# Patient Record
Sex: Female | Born: 1975 | Race: Black or African American | Hispanic: No | Marital: Married | Smoking: Never smoker
Health system: Southern US, Community
[De-identification: ages and names within clinical notes are randomized; demographics above are authoritative.]

## PROBLEM LIST (undated history)

## (undated) DIAGNOSIS — E559 Vitamin D deficiency, unspecified: Secondary | ICD-10-CM

## (undated) DIAGNOSIS — R0600 Dyspnea, unspecified: Secondary | ICD-10-CM

## (undated) DIAGNOSIS — G932 Benign intracranial hypertension: Secondary | ICD-10-CM

## (undated) DIAGNOSIS — G4730 Sleep apnea, unspecified: Secondary | ICD-10-CM

## (undated) DIAGNOSIS — Z9109 Other allergy status, other than to drugs and biological substances: Secondary | ICD-10-CM

## (undated) DIAGNOSIS — K5900 Constipation, unspecified: Secondary | ICD-10-CM

## (undated) DIAGNOSIS — T7840XA Allergy, unspecified, initial encounter: Secondary | ICD-10-CM

## (undated) DIAGNOSIS — D649 Anemia, unspecified: Secondary | ICD-10-CM

## (undated) DIAGNOSIS — I10 Essential (primary) hypertension: Secondary | ICD-10-CM

## (undated) HISTORY — DX: Essential (primary) hypertension: I10

## (undated) HISTORY — DX: Constipation, unspecified: K59.00

## (undated) HISTORY — DX: Sleep apnea, unspecified: G47.30

## (undated) HISTORY — DX: Vitamin D deficiency, unspecified: E55.9

## (undated) HISTORY — PX: BREAST SURGERY: SHX581

## (undated) HISTORY — DX: Benign intracranial hypertension: G93.2

## (undated) HISTORY — DX: Other allergy status, other than to drugs and biological substances: Z91.09

## (undated) HISTORY — DX: Allergy, unspecified, initial encounter: T78.40XA

## (undated) HISTORY — DX: Anemia, unspecified: D64.9

## (undated) HISTORY — DX: Dyspnea, unspecified: R06.00

---

## 1999-01-09 ENCOUNTER — Other Ambulatory Visit: Admission: RE | Admit: 1999-01-09 | Discharge: 1999-01-09 | Payer: Self-pay | Admitting: *Deleted

## 1999-04-13 ENCOUNTER — Other Ambulatory Visit: Admission: RE | Admit: 1999-04-13 | Discharge: 1999-04-13 | Payer: Self-pay | Admitting: *Deleted

## 2000-01-24 ENCOUNTER — Other Ambulatory Visit: Admission: RE | Admit: 2000-01-24 | Discharge: 2000-01-24 | Payer: Self-pay | Admitting: *Deleted

## 2000-03-19 HISTORY — PX: REDUCTION MAMMAPLASTY: SUR839

## 2001-03-24 ENCOUNTER — Other Ambulatory Visit: Admission: RE | Admit: 2001-03-24 | Discharge: 2001-03-24 | Payer: Self-pay | Admitting: Obstetrics and Gynecology

## 2002-06-30 ENCOUNTER — Other Ambulatory Visit: Admission: RE | Admit: 2002-06-30 | Discharge: 2002-06-30 | Payer: Self-pay | Admitting: Gynecology

## 2003-08-13 ENCOUNTER — Other Ambulatory Visit: Admission: RE | Admit: 2003-08-13 | Discharge: 2003-08-13 | Payer: Self-pay | Admitting: Gynecology

## 2003-12-07 ENCOUNTER — Other Ambulatory Visit: Admission: RE | Admit: 2003-12-07 | Discharge: 2003-12-07 | Payer: Self-pay | Admitting: Gynecology

## 2004-10-27 ENCOUNTER — Other Ambulatory Visit: Admission: RE | Admit: 2004-10-27 | Discharge: 2004-10-27 | Payer: Self-pay | Admitting: Gynecology

## 2005-11-01 ENCOUNTER — Other Ambulatory Visit: Admission: RE | Admit: 2005-11-01 | Discharge: 2005-11-01 | Payer: Self-pay | Admitting: Gynecology

## 2006-11-13 ENCOUNTER — Inpatient Hospital Stay (HOSPITAL_COMMUNITY): Admission: AD | Admit: 2006-11-13 | Discharge: 2006-11-16 | Payer: Self-pay | Admitting: Obstetrics and Gynecology

## 2006-11-14 ENCOUNTER — Encounter (HOSPITAL_COMMUNITY): Payer: Self-pay | Admitting: Obstetrics and Gynecology

## 2006-11-18 ENCOUNTER — Encounter: Admission: RE | Admit: 2006-11-18 | Discharge: 2006-12-17 | Payer: Self-pay | Admitting: Obstetrics and Gynecology

## 2009-07-04 ENCOUNTER — Encounter: Admission: RE | Admit: 2009-07-04 | Discharge: 2009-07-04 | Payer: Self-pay | Admitting: Internal Medicine

## 2010-12-29 LAB — LACTATE DEHYDROGENASE: LDH: 132

## 2010-12-29 LAB — CBC
HCT: 27.1 — ABNORMAL LOW
HCT: 34.7 — ABNORMAL LOW
Hemoglobin: 11.6 — ABNORMAL LOW
MCHC: 32.6
MCHC: 33
MCV: 75.8 — ABNORMAL LOW
Platelets: 200
RDW: 14.6 — ABNORMAL HIGH
RDW: 14.9 — ABNORMAL HIGH
RDW: 14.9 — ABNORMAL HIGH
WBC: 10.4
WBC: 11 — ABNORMAL HIGH
WBC: 20 — ABNORMAL HIGH

## 2010-12-29 LAB — COMPREHENSIVE METABOLIC PANEL
ALT: 13
ALT: 13
AST: 18
Alkaline Phosphatase: 202 — ABNORMAL HIGH
BUN: 1 — ABNORMAL LOW
Calcium: 8.9
Calcium: 9
Chloride: 108
Chloride: 108
GFR calc Af Amer: >60
GFR calc non Af Amer: >60
Glucose, Bld: 88
Sodium: 135
Sodium: 135
Total Bilirubin: 0.5
Total Bilirubin: 0.6

## 2010-12-29 LAB — RPR: RPR Ser Ql: NONREACTIVE

## 2010-12-29 LAB — RAPID HIV SCREEN (WH-MAU): Rapid HIV Screen: NONREACTIVE

## 2010-12-29 LAB — URIC ACID: Uric Acid, Serum: 5.5

## 2011-02-07 ENCOUNTER — Other Ambulatory Visit: Payer: Self-pay | Admitting: Obstetrics and Gynecology

## 2011-02-07 DIAGNOSIS — N63 Unspecified lump in unspecified breast: Secondary | ICD-10-CM

## 2011-02-21 ENCOUNTER — Other Ambulatory Visit: Payer: Self-pay

## 2011-02-26 ENCOUNTER — Ambulatory Visit
Admission: RE | Admit: 2011-02-26 | Discharge: 2011-02-26 | Disposition: A | Discharge status: Home / Self Care | Payer: BC Managed Care – PPO | Source: Ambulatory Visit | Attending: Obstetrics and Gynecology | Admitting: Obstetrics and Gynecology

## 2011-02-26 DIAGNOSIS — N63 Unspecified lump in unspecified breast: Secondary | ICD-10-CM

## 2011-04-27 ENCOUNTER — Other Ambulatory Visit (HOSPITAL_COMMUNITY): Payer: Self-pay | Admitting: Pediatrics

## 2011-07-18 ENCOUNTER — Other Ambulatory Visit: Payer: Self-pay | Admitting: Physician Assistant

## 2011-07-19 ENCOUNTER — Encounter: Payer: Self-pay | Admitting: Physician Assistant

## 2011-08-16 ENCOUNTER — Ambulatory Visit (INDEPENDENT_AMBULATORY_CARE_PROVIDER_SITE_OTHER): Payer: BC Managed Care – PPO | Admitting: Physician Assistant

## 2011-08-16 ENCOUNTER — Encounter: Payer: Self-pay | Admitting: Physician Assistant

## 2011-08-16 VITALS — BP 122/86 | HR 85 | Temp 98.1°F | Resp 16 | Ht 64.0 in | Wt 245.2 lb

## 2011-08-16 DIAGNOSIS — Z111 Encounter for screening for respiratory tuberculosis: Secondary | ICD-10-CM

## 2011-08-16 DIAGNOSIS — Z0000 Encounter for general adult medical examination without abnormal findings: Secondary | ICD-10-CM

## 2011-08-16 LAB — POCT URINALYSIS DIPSTICK
Bilirubin, UA: NEGATIVE
Glucose, UA: NEGATIVE
Ketones, UA: NEGATIVE
Leukocytes, UA: NEGATIVE
Nitrite, UA: NEGATIVE
Protein, UA: NEGATIVE
Spec Grav, UA: 1.025
Urobilinogen, UA: 0.2
pH, UA: 5.5

## 2011-08-16 LAB — COMPREHENSIVE METABOLIC PANEL
ALT: 9 U/L (ref 0–35)
AST: 11 U/L (ref 0–37)
Albumin: 4.2 g/dL (ref 3.5–5.2)
Alkaline Phosphatase: 118 U/L — ABNORMAL HIGH (ref 39–117)
BUN: 9 mg/dL (ref 6–23)
CO2: 24 mEq/L (ref 19–32)
Calcium: 8.9 mg/dL (ref 8.4–10.5)
Chloride: 103 mEq/L (ref 96–112)
Creat: 0.67 mg/dL (ref 0.50–1.10)
Glucose, Bld: 87 mg/dL (ref 70–99)
Potassium: 3.9 mEq/L (ref 3.5–5.3)
Sodium: 135 mEq/L (ref 135–145)
Total Bilirubin: 0.5 mg/dL (ref 0.3–1.2)
Total Protein: 7.6 g/dL (ref 6.0–8.3)

## 2011-08-16 LAB — LIPID PANEL
Cholesterol: 170 mg/dL (ref 0–200)
HDL: 44 mg/dL (ref >39)
LDL Cholesterol: 107 mg/dL — ABNORMAL HIGH (ref 0–99)
Total CHOL/HDL Ratio: 3.9 Ratio
Triglycerides: 97 mg/dL (ref <150)
VLDL: 19 mg/dL (ref 0–40)

## 2011-08-16 LAB — TSH: TSH: 1.292 u[IU]/mL (ref 0.350–4.500)

## 2011-08-16 MED ORDER — HYDROCHLOROTHIAZIDE 12.5 MG PO CAPS: 12.5000 mg | ORAL_CAPSULE | Freq: Every day | ORAL | Status: DC

## 2011-08-16 MED ORDER — MOMETASONE FUROATE 50 MCG/ACT NA SUSP: 2.0000 | Freq: Every day | NASAL | Status: DC

## 2011-08-16 NOTE — Progress Notes (Signed)
  Subjective:    Patient ID: Mary Mcdaniel, female    DOB: 07-01-1975, 36 y.o.   MRN: [id number]  HPI Patient presents for CPE. States she is doing well and continues to work as a Scientist, clinical (histocompatibility and immunogenetics). She is still taking HCTZ 12.5 mg daily for her blood pressure but states she has not been taking it this month, although BP still ok. She sees a Gyn for her annual pap and breast exam. Had a mammogram in Dec. 2012 due to finding a lump but everything was normal. She uses Nasonex and Claritin as needed for seasonal allergies.   In addition she has complaints of mild hearing loss and snoring. Believes snoring could be due to continued weight gain.  She is working on exercise and healthy eating.    Review of Systems  All other systems reviewed and are negative.       Objective:   Physical Exam  Vitals reviewed. Constitutional: She is oriented to person, place, and time. She appears well-developed and well-nourished. No distress.  HENT:  Head: Normocephalic and atraumatic.  Right Ear: Hearing, tympanic membrane, external ear and ear canal normal.  Left Ear: Hearing, tympanic membrane, external ear and ear canal normal.  Nose: Nose normal.  Mouth/Throat: Uvula is midline, oropharynx is clear and moist and mucous membranes are normal. No oropharyngeal exudate.  Eyes: Conjunctivae, EOM and lids are normal. Pupils are equal, round, and reactive to light.  Neck: Neck supple. No thyromegaly present.  Cardiovascular: Normal rate, regular rhythm and normal heart sounds.   Pulmonary/Chest: Effort normal and breath sounds normal.  Abdominal: Soft. Bowel sounds are normal. There is no hepatosplenomegaly. There is no tenderness.  Genitourinary: Vagina normal.  Musculoskeletal: Normal range of motion.       Right shoulder: Normal.       Left shoulder: Normal.       Right knee: Normal.       Left knee: Normal.  Lymphadenopathy:    She has no cervical adenopathy.  Neurological: She is alert and  oriented to person, place, and time. No cranial nerve deficit.  Skin: Skin is warm and dry. She is not diaphoretic.  Psychiatric: She has a normal mood and affect. Her behavior is normal. Judgment and thought content normal.          Assessment & Plan:   1. Routine general medical examination at a health care facility  Continue to work on healthy diet and exercise. This will help with blood pressure as well as snoring.  Refill HCTZ 12.5 mg daily x 6 months then follow up. Blood pressure well controlled today. May consider d/c medication especially if she has some weight loss.  Reassurance that audiometry results were normal. If continued concern may consider referral to Audiologist. POCT urinalysis dipstick, CBC with Differential, Comprehensive metabolic panel, Lipid panel, TSH  2. Screening examination for pulmonary tuberculosis  Return in 48-72 hours for TB read.  TB Skin Test

## 2011-08-16 NOTE — Patient Instructions (Signed)

## 2011-08-16 NOTE — Progress Notes (Signed)
Precepted with Ms. Marte, PA-C and agree.  

## 2011-08-17 LAB — CBC WITH DIFFERENTIAL/PLATELET
Basophils Absolute: 0 10*3/uL (ref 0.0–0.1)
Basophils Relative: 1 % (ref 0–1)
Eosinophils Absolute: 0.1 10*3/uL (ref 0.0–0.7)
Eosinophils Relative: 1 % (ref 0–5)
HCT: 36.7 % (ref 36.0–46.0)
Hemoglobin: 11.6 g/dL — ABNORMAL LOW (ref 12.0–15.0)
Lymphocytes Relative: 37 % (ref 12–46)
Lymphs Abs: 2.3 10*3/uL (ref 0.7–4.0)
MCH: 22.7 pg — ABNORMAL LOW (ref 26.0–34.0)
MCHC: 31.6 g/dL (ref 30.0–36.0)
MCV: 71.8 fL — ABNORMAL LOW (ref 78.0–100.0)
Monocytes Absolute: 0.5 10*3/uL (ref 0.1–1.0)
Monocytes Relative: 9 % (ref 3–12)
Neutro Abs: 3.4 10*3/uL (ref 1.7–7.7)
Neutrophils Relative %: 52 % (ref 43–77)
Platelets: 293 10*3/uL (ref 150–400)
RBC: 5.11 MIL/uL (ref 3.87–5.11)
RDW: 15 % (ref 11.5–15.5)
WBC: 6.3 10*3/uL (ref 4.0–10.5)

## 2011-08-19 ENCOUNTER — Encounter: Payer: Self-pay | Admitting: Physician Assistant

## 2011-08-19 ENCOUNTER — Encounter (INDEPENDENT_AMBULATORY_CARE_PROVIDER_SITE_OTHER): Payer: BC Managed Care – PPO

## 2011-08-19 DIAGNOSIS — Z111 Encounter for screening for respiratory tuberculosis: Secondary | ICD-10-CM

## 2011-08-28 ENCOUNTER — Telehealth: Payer: Self-pay

## 2011-08-28 NOTE — Telephone Encounter (Signed)
PT STATES THAT SHE DROPPED OFF A ONE PAGE PHYSICAL FORM LAST WEEK AND HAS NOT RECEIVED A CALL AS OF YET. PLEASE ADVISE.

## 2011-08-29 NOTE — Telephone Encounter (Signed)
Called pt and let her know that her form did not make it to the provider when she dropped it off. She reported that she dropped it at 104, but she will be happy to drop off another copy tomorrow morning. I asked her to drop off at 102 and have them give it to [place]. Pt agreed.

## 2011-08-30 NOTE — Telephone Encounter (Signed)
Pt dropped off form. I have filled out what I can and have placed form in [facility] box for completion.

## 2011-08-31 ENCOUNTER — Telehealth: Payer: Self-pay

## 2011-08-31 NOTE — Telephone Encounter (Signed)
Form completed. Please notify patient it is ready for pick up

## 2011-10-19 LAB — TB SKIN TEST

## 2012-01-24 LAB — OB RESULTS CONSOLE RPR: RPR: NONREACTIVE

## 2012-01-24 LAB — OB RESULTS CONSOLE GC/CHLAMYDIA: Chlamydia: NEGATIVE

## 2012-01-24 LAB — OB RESULTS CONSOLE HEPATITIS B SURFACE ANTIGEN: Hepatitis B Surface Ag: NEGATIVE

## 2012-02-19 ENCOUNTER — Ambulatory Visit: Payer: BC Managed Care – PPO | Admitting: Physician Assistant

## 2012-03-19 NOTE — L&D Delivery Note (Signed)
Delivery Note At 2:49 PM a viable female was delivered via Vaginal, Spontaneous Delivery (Presentation: Left Occiput Anterior).  APGAR: 9, 10; weight .   Placenta status:intact , .  Cord: 3 vessels with the following complications: None.  Cord pH: na  Anesthesia: Epidural  Episiotomy: None Lacerations: None Suture Repair: na Est. Blood Loss (mL): 350  Mom to postpartum.  Baby to nursery-stable.  Jeannie Mallinger S 08/18/2012, 3:05 PM

## 2012-08-07 LAB — OB RESULTS CONSOLE GBS: GBS: NEGATIVE

## 2012-08-18 ENCOUNTER — Encounter (HOSPITAL_COMMUNITY): Payer: Self-pay | Admitting: Anesthesiology

## 2012-08-18 ENCOUNTER — Encounter (HOSPITAL_COMMUNITY): Payer: Self-pay | Admitting: *Deleted

## 2012-08-18 ENCOUNTER — Inpatient Hospital Stay (HOSPITAL_COMMUNITY)
Admission: AD | Admit: 2012-08-18 | Discharge: 2012-08-20 | DRG: 373 | Disposition: A | Discharge status: Home / Self Care | Payer: BC Managed Care – PPO | Source: Ambulatory Visit | Attending: Obstetrics and Gynecology | Admitting: Obstetrics and Gynecology

## 2012-08-18 ENCOUNTER — Inpatient Hospital Stay (HOSPITAL_COMMUNITY): Payer: BC Managed Care – PPO | Admitting: Anesthesiology

## 2012-08-18 DIAGNOSIS — O09529 Supervision of elderly multigravida, unspecified trimester: Principal | ICD-10-CM | POA: Diagnosis present

## 2012-08-18 LAB — CBC
HCT: 35.5 % — ABNORMAL LOW (ref 36.0–46.0)
Hemoglobin: 11.5 g/dL — ABNORMAL LOW (ref 12.0–15.0)
MCH: 23.7 pg — ABNORMAL LOW (ref 26.0–34.0)
MCHC: 32.4 g/dL (ref 30.0–36.0)
MCV: 73 fL — ABNORMAL LOW (ref 78.0–100.0)
RDW: 14.5 % (ref 11.5–15.5)

## 2012-08-18 MED ORDER — OXYTOCIN BOLUS FROM INFUSION
500.0000 mL | INTRAVENOUS | Status: DC
  Administered 2012-08-18: 500 mL via INTRAVENOUS

## 2012-08-18 MED ORDER — PHENYLEPHRINE 40 MCG/ML (10ML) SYRINGE FOR IV PUSH (FOR BLOOD PRESSURE SUPPORT)
80.0000 ug | PREFILLED_SYRINGE | INTRAVENOUS | Status: DC | PRN
  Filled 2012-08-18: qty 5

## 2012-08-18 MED ORDER — BENZOCAINE-MENTHOL 20-0.5 % EX AERO
1.0000 "application " | INHALATION_SPRAY | CUTANEOUS | Status: DC | PRN
  Administered 2012-08-18: 1 via TOPICAL
  Filled 2012-08-18: qty 56

## 2012-08-18 MED ORDER — OXYCODONE-ACETAMINOPHEN 5-325 MG PO TABS: 1.0000 | ORAL_TABLET | ORAL | Status: DC | PRN

## 2012-08-18 MED ORDER — LANOLIN HYDROUS EX OINT: TOPICAL_OINTMENT | CUTANEOUS | Status: DC | PRN

## 2012-08-18 MED ORDER — IBUPROFEN 600 MG PO TABS
600.0000 mg | ORAL_TABLET | Freq: Four times a day (QID) | ORAL | Status: DC
  Administered 2012-08-18 – 2012-08-20 (×8): 600 mg via ORAL
  Filled 2012-08-18 (×8): qty 1

## 2012-08-18 MED ORDER — FLEET ENEMA 7-19 GM/118ML RE ENEM: 1.0000 | ENEMA | Freq: Every day | RECTAL | Status: DC | PRN

## 2012-08-18 MED ORDER — ONDANSETRON HCL 4 MG/2ML IJ SOLN: 4.0000 mg | Freq: Four times a day (QID) | INTRAMUSCULAR | Status: DC | PRN

## 2012-08-18 MED ORDER — PHENYLEPHRINE 40 MCG/ML (10ML) SYRINGE FOR IV PUSH (FOR BLOOD PRESSURE SUPPORT): 80.0000 ug | PREFILLED_SYRINGE | INTRAVENOUS | Status: DC | PRN

## 2012-08-18 MED ORDER — LACTATED RINGERS IV SOLN: 500.0000 mL | Freq: Once | INTRAVENOUS | Status: DC

## 2012-08-18 MED ORDER — LACTATED RINGERS IV SOLN
INTRAVENOUS | Status: DC
  Administered 2012-08-18 (×2): via INTRAVENOUS

## 2012-08-18 MED ORDER — WITCH HAZEL-GLYCERIN EX PADS: 1.0000 "application " | MEDICATED_PAD | CUTANEOUS | Status: DC | PRN

## 2012-08-18 MED ORDER — ZOLPIDEM TARTRATE 5 MG PO TABS: 5.0000 mg | ORAL_TABLET | Freq: Every evening | ORAL | Status: DC | PRN

## 2012-08-18 MED ORDER — TETANUS-DIPHTH-ACELL PERTUSSIS 5-2.5-18.5 LF-MCG/0.5 IM SUSP: 0.5000 mL | Freq: Once | INTRAMUSCULAR | Status: DC

## 2012-08-18 MED ORDER — ONDANSETRON HCL 4 MG/2ML IJ SOLN: 4.0000 mg | INTRAMUSCULAR | Status: DC | PRN

## 2012-08-18 MED ORDER — DIBUCAINE 1 % RE OINT: 1.0000 "application " | TOPICAL_OINTMENT | RECTAL | Status: DC | PRN

## 2012-08-18 MED ORDER — TERBUTALINE SULFATE 1 MG/ML IJ SOLN: 0.2500 mg | Freq: Once | INTRAMUSCULAR | Status: DC | PRN

## 2012-08-18 MED ORDER — EPHEDRINE 5 MG/ML INJ
10.0000 mg | INTRAVENOUS | Status: DC | PRN
Start: 2012-08-18 — End: 2012-08-18
  Filled 2012-08-18: qty 8

## 2012-08-18 MED ORDER — SENNOSIDES-DOCUSATE SODIUM 8.6-50 MG PO TABS
2.0000 | ORAL_TABLET | Freq: Every day | ORAL | Status: DC
  Administered 2012-08-18 – 2012-08-19 (×2): 2 via ORAL

## 2012-08-18 MED ORDER — DIPHENHYDRAMINE HCL 50 MG/ML IJ SOLN: 12.5000 mg | INTRAMUSCULAR | Status: DC | PRN

## 2012-08-18 MED ORDER — FLEET ENEMA 7-19 GM/118ML RE ENEM: 1.0000 | ENEMA | RECTAL | Status: DC | PRN

## 2012-08-18 MED ORDER — LACTATED RINGERS IV SOLN
500.0000 mL | INTRAVENOUS | Status: DC | PRN
  Administered 2012-08-18: 1000 mL via INTRAVENOUS

## 2012-08-18 MED ORDER — ONDANSETRON HCL 4 MG PO TABS: 4.0000 mg | ORAL_TABLET | ORAL | Status: DC | PRN

## 2012-08-18 MED ORDER — EPHEDRINE 5 MG/ML INJ: 10.0000 mg | INTRAVENOUS | Status: DC | PRN

## 2012-08-18 MED ORDER — LIDOCAINE HCL (PF) 1 % IJ SOLN
INTRAMUSCULAR | Status: DC | PRN
  Administered 2012-08-18 (×4): 4 mL

## 2012-08-18 MED ORDER — CITRIC ACID-SODIUM CITRATE 334-500 MG/5ML PO SOLN: 30.0000 mL | ORAL | Status: DC | PRN

## 2012-08-18 MED ORDER — OXYTOCIN 40 UNITS IN LACTATED RINGERS INFUSION - SIMPLE MED
62.5000 mL/h | INTRAVENOUS | Status: DC
  Administered 2012-08-18: 62.5 mL/h via INTRAVENOUS

## 2012-08-18 MED ORDER — LIDOCAINE HCL (PF) 1 % IJ SOLN: 30.0000 mL | INTRAMUSCULAR | Status: DC | PRN

## 2012-08-18 MED ORDER — PRENATAL MULTIVITAMIN CH
1.0000 | ORAL_TABLET | Freq: Every day | ORAL | Status: DC
  Administered 2012-08-19: 1 via ORAL
  Filled 2012-08-18: qty 1

## 2012-08-18 MED ORDER — IBUPROFEN 600 MG PO TABS: 600.0000 mg | ORAL_TABLET | Freq: Four times a day (QID) | ORAL | Status: DC | PRN

## 2012-08-18 MED ORDER — OXYTOCIN 40 UNITS IN LACTATED RINGERS INFUSION - SIMPLE MED
1.0000 m[IU]/min | INTRAVENOUS | Status: DC
  Administered 2012-08-18: 2 m[IU]/min via INTRAVENOUS
  Filled 2012-08-18: qty 1000

## 2012-08-18 MED ORDER — ACETAMINOPHEN 325 MG PO TABS: 650.0000 mg | ORAL_TABLET | ORAL | Status: DC | PRN

## 2012-08-18 MED ORDER — DIPHENHYDRAMINE HCL 25 MG PO CAPS: 25.0000 mg | ORAL_CAPSULE | Freq: Four times a day (QID) | ORAL | Status: DC | PRN

## 2012-08-18 MED ORDER — FENTANYL 2.5 MCG/ML BUPIVACAINE 1/10 % EPIDURAL INFUSION (WH - ANES)
14.0000 mL/h | INTRAMUSCULAR | Status: DC | PRN
  Administered 2012-08-18: 14 mL/h via EPIDURAL
  Filled 2012-08-18: qty 125

## 2012-08-18 MED ORDER — BISACODYL 10 MG RE SUPP: 10.0000 mg | Freq: Every day | RECTAL | Status: DC | PRN

## 2012-08-18 MED ORDER — SIMETHICONE 80 MG PO CHEW: 80.0000 mg | CHEWABLE_TABLET | ORAL | Status: DC | PRN

## 2012-08-18 NOTE — H&P (Signed)
Mary Mcdaniel is a 37 y.o. female presenting at 2 weeks with SROM.  PNC complicated by AMA normal maternat21.  Negative GBS. Maternal Medical History:  Reason for admission: Rupture of membranes.   Contractions: Onset was 1-2 hours ago.   Frequency: irregular.    Fetal activity: Perceived fetal activity is normal.    Prenatal complications: AMA NORMAL MATERNAT21  Prenatal Complications - Diabetes: none.    OB History   Grav Para Term Preterm Abortions TAB SAB Ect Mult Living   2 1 1  0 0 0 0 0 0 1     Past Medical History  Diagnosis Date  . Allergy   . Medical history non-contributory    Past Surgical History  Procedure Laterality Date  . Breast surgery     Family History: family history includes Cancer in her maternal grandfather and mother; Diabetes in her paternal grandmother; and Hypertension in her father. Social History:  reports that she has never smoked. She does not have any smokeless tobacco history on file. She reports that she does not drink alcohol or use illicit drugs.   Prenatal Transfer Tool  Maternal Diabetes: No Genetic Screening: Normal Maternal Ultrasounds/Referrals: Normal Fetal Ultrasounds or other Referrals:  None Maternal Substance Abuse:  No Significant Maternal Medications:  None Significant Maternal Lab Results:  None Other Comments:  None  ROS  Dilation: 3 Effacement (%): 70 Station: -2 Exam by:: D biggs, Rn Blood pressure 122/78, pulse 82, temperature 98.2 F (36.8 C), temperature source Oral, resp. rate 20, height 5\' 3"  (1.6 m), weight 113.399 kg (250 lb). Maternal Exam:  Uterine Assessment: Contraction strength is moderate.  Contraction frequency is irregular.   Abdomen: Fundal height is C/W DATES.   Estimated fetal weight is 7.   Fetal presentation: vertex  Introitus: Amniotic fluid character: clear.  Pelvis: adequate for delivery.   Cervix: 3 CM AND 70 %  Physical Exam  Prenatal labs: ABO, Rh: A/Positive/-- (11/07  0000) Antibody: Negative (11/07 0000) Rubella: Immune (11/07 0000) RPR: Nonreactive (11/07 0000)  HBsAg: Negative (11/07 0000)  HIV: Non-reactive (11/07 0000)  GBS: Negative (05/22 0000)   Assessment/Plan: Intrauterine pregnancy at 38 weeks with SROM Routine labor and delivery   Meta Kroenke S 08/18/2012, 12:25 PM

## 2012-08-18 NOTE — Anesthesia Preprocedure Evaluation (Signed)
Anesthesia Evaluation  Patient identified by MRN, date of birth, ID band Patient awake    Reviewed: Allergy & Precautions, H&P , NPO status , Patient's Chart, lab work & pertinent test results, reviewed documented beta blocker date and time   History of Anesthesia Complications Negative for: history of anesthetic complications  Airway Mallampati: III TM Distance: >3 FB Neck ROM: full    Dental  (+) Teeth Intact   Pulmonary neg pulmonary ROS,  breath sounds clear to auscultation        Cardiovascular negative cardio ROS  Rhythm:regular Rate:Normal     Neuro/Psych  Headaches (migraines - less in pregnancy), negative psych ROS   GI/Hepatic negative GI ROS, Neg liver ROS,   Endo/Other  Morbid obesity  Renal/GU negative Renal ROS  negative genitourinary   Musculoskeletal   Abdominal   Peds  Hematology negative hematology ROS (+)   Anesthesia Other Findings   Reproductive/Obstetrics (+) Pregnancy                           Anesthesia Physical Anesthesia Plan  ASA: III  Anesthesia Plan: Epidural   Post-op Pain Management:    Induction:   Airway Management Planned:   Additional Equipment:   Intra-op Plan:   Post-operative Plan:   Informed Consent: I have reviewed the patients History and Physical, chart, labs and discussed the procedure including the risks, benefits and alternatives for the proposed anesthesia with the patient or authorized representative who has indicated his/her understanding and acceptance.     Plan Discussed with:   Anesthesia Plan Comments:         Anesthesia Quick Evaluation

## 2012-08-18 NOTE — Anesthesia Procedure Notes (Signed)
Epidural Patient location during procedure: OB Start time: 08/18/2012 1:50 PM  Staffing Performed by: anesthesiologist   Preanesthetic Checklist Completed: patient identified, site marked, surgical consent, pre-op evaluation, timeout performed, IV checked, risks and benefits discussed and monitors and equipment checked  Epidural Patient position: sitting Prep: site prepped and draped and DuraPrep Patient monitoring: continuous pulse ox and blood pressure Approach: midline Injection technique: LOR air  Needle:  Needle type: Tuohy  Needle gauge: 17 G Needle length: 9 cm and 9 Needle insertion depth: 7 cm Catheter type: closed end flexible Catheter size: 19 Gauge Catheter at skin depth: 12 cm Test dose: negative  Assessment Events: blood not aspirated, injection not painful, no injection resistance, negative IV test and no paresthesia  Additional Notes Discussed risk of headache, infection, bleeding, nerve injury and failed or incomplete block.  Patient voices understanding and wishes to proceed.  Epidural placed easily on first attempt.  No paresthesia.  Patient tolerated procedure well with no apparent complications.  [name], MDReason for block:procedure for pain

## 2012-08-19 LAB — CBC
HCT: 32.3 % — ABNORMAL LOW (ref 36.0–46.0)
MCH: 23.4 pg — ABNORMAL LOW (ref 26.0–34.0)
MCV: 73.2 fL — ABNORMAL LOW (ref 78.0–100.0)
Platelets: 170 10*3/uL (ref 150–400)
RDW: 14.5 % (ref 11.5–15.5)

## 2012-08-19 NOTE — Anesthesia Postprocedure Evaluation (Signed)
  Anesthesia Post-op Note  Patient: Mary Mcdaniel  Procedure(s) Performed: * No procedures listed *  Patient Location: Mother/Baby  Anesthesia Type:Epidural  Level of Consciousness: awake  Airway and Oxygen Therapy: Patient Spontanous Breathing  Post-op Pain: none  Post-op Assessment: Patient's Cardiovascular Status Stable, Respiratory Function Stable, No signs of Nausea or vomiting, Adequate PO intake, Pain level controlled, No headache and No backache  Post-op Vital Signs: Reviewed and stable  Complications: No apparent anesthesia complications

## 2012-08-19 NOTE — Progress Notes (Signed)
Post Partum Day 1 Subjective: no complaints, up ad lib, voiding and tolerating PO  Objective: Blood pressure 116/76, pulse 91, temperature 98.7 F (37.1 C), temperature source Oral, resp. rate 20, height 5\' 3"  (1.6 m), weight 250 lb (113.399 kg), SpO2 100.00%, unknown if currently breastfeeding.  Physical Exam:  General: alert and cooperative Lochia: appropriate Uterine Fundus: firm Incision: perineum intact DVT Evaluation: No evidence of DVT seen on physical exam. Negative Homan's sign. No cords or calf tenderness.   Recent Labs  08/18/12 1053 08/19/12 0605  HGB 11.5* 10.3*  HCT 35.5* 32.3*    Assessment/Plan: Plan for discharge tomorrow and Circumcision prior to discharge   LOS: 1 day   Mary Mcdaniel,Mary Mcdaniel 08/19/2012, 8:05 AM

## 2012-08-20 ENCOUNTER — Encounter (HOSPITAL_COMMUNITY)
Admission: RE | Admit: 2012-08-20 | Discharge: 2012-08-20 | Disposition: A | Discharge status: Home / Self Care | Payer: BC Managed Care – PPO | Source: Ambulatory Visit | Attending: Obstetrics and Gynecology | Admitting: Obstetrics and Gynecology

## 2012-08-20 DIAGNOSIS — O923 Agalactia: Secondary | ICD-10-CM | POA: Insufficient documentation

## 2012-08-20 NOTE — Discharge Summary (Signed)
Obstetric Discharge Summary Reason for Admission: rupture of membranes Prenatal Procedures: ultrasound Intrapartum Procedures: spontaneous vaginal delivery Postpartum Procedures: none Complications-Operative and Postpartum: none Hemoglobin  Date Value Range Status  08/19/2012 10.3* 12.0 - 15.0 g/dL Final     HCT  Date Value Range Status  08/19/2012 32.3* 36.0 - 46.0 % Final    Physical Exam:  General: alert and cooperative Lochia: appropriate Uterine Fundus: firm Incision: perineum intact DVT Evaluation: No evidence of DVT seen on physical exam. Negative Homan's sign. No cords or calf tenderness.  Discharge Diagnoses: Term Pregnancy-delivered  Discharge Information: Date: 08/20/2012 Activity: pelvic rest Diet: routine Medications: PNV and Ibuprofen Condition: stable Instructions: refer to practice specific booklet Discharge to: home   Newborn Data: Live born female  Birth Weight: 7 lb 8 oz (3402 g) APGAR: 9, 10  Home with mother.  Mary Mcdaniel G 08/20/2012, 8:24 AM

## 2012-09-20 ENCOUNTER — Encounter (HOSPITAL_COMMUNITY)
Admission: RE | Admit: 2012-09-20 | Discharge: 2012-09-20 | Disposition: A | Discharge status: Home / Self Care | Payer: BC Managed Care – PPO | Source: Ambulatory Visit | Attending: Obstetrics and Gynecology | Admitting: Obstetrics and Gynecology

## 2012-09-20 DIAGNOSIS — O923 Agalactia: Secondary | ICD-10-CM | POA: Insufficient documentation

## 2012-11-20 ENCOUNTER — Encounter: Payer: BC Managed Care – PPO | Admitting: Physician Assistant

## 2012-12-18 ENCOUNTER — Ambulatory Visit (INDEPENDENT_AMBULATORY_CARE_PROVIDER_SITE_OTHER): Payer: BC Managed Care – PPO | Admitting: Physician Assistant

## 2012-12-18 ENCOUNTER — Encounter: Payer: Self-pay | Admitting: Physician Assistant

## 2012-12-18 VITALS — BP 150/82 | HR 73 | Temp 98.2°F | Resp 16 | Ht 63.0 in | Wt 238.0 lb

## 2012-12-18 DIAGNOSIS — Z79899 Other long term (current) drug therapy: Secondary | ICD-10-CM

## 2012-12-18 DIAGNOSIS — B351 Tinea unguium: Secondary | ICD-10-CM

## 2012-12-18 DIAGNOSIS — R7989 Other specified abnormal findings of blood chemistry: Secondary | ICD-10-CM

## 2012-12-18 DIAGNOSIS — Z0000 Encounter for general adult medical examination without abnormal findings: Secondary | ICD-10-CM

## 2012-12-18 DIAGNOSIS — Z23 Encounter for immunization: Secondary | ICD-10-CM

## 2012-12-18 LAB — CBC WITH DIFFERENTIAL/PLATELET
Basophils Relative: 1 % (ref 0–1)
Eosinophils Relative: 1 % (ref 0–5)
HCT: 35.6 % — ABNORMAL LOW (ref 36.0–46.0)
Hemoglobin: 11.5 g/dL — ABNORMAL LOW (ref 12.0–15.0)
Lymphocytes Relative: 34 % (ref 12–46)
MCHC: 32.3 g/dL (ref 30.0–36.0)
MCV: 72.1 fL — ABNORMAL LOW (ref 78.0–100.0)
Monocytes Absolute: 0.4 10*3/uL (ref 0.1–1.0)
Monocytes Relative: 7 % (ref 3–12)
Neutro Abs: 3.5 10*3/uL (ref 1.7–7.7)

## 2012-12-18 LAB — COMPREHENSIVE METABOLIC PANEL
AST: 11 U/L (ref 0–37)
Albumin: 4.1 g/dL (ref 3.5–5.2)
BUN: 9 mg/dL (ref 6–23)
CO2: 27 mEq/L (ref 19–32)
Calcium: 9.3 mg/dL (ref 8.4–10.5)
Chloride: 103 mEq/L (ref 96–112)
Creat: 0.57 mg/dL (ref 0.50–1.10)
Glucose, Bld: 82 mg/dL (ref 70–99)
Potassium: 4.1 mEq/L (ref 3.5–5.3)

## 2012-12-18 LAB — LIPID PANEL
Cholesterol: 175 mg/dL (ref 0–200)
HDL: 43 mg/dL (ref >39)
Total CHOL/HDL Ratio: 4.1 Ratio

## 2012-12-18 LAB — POCT UA - MICROSCOPIC ONLY
Bacteria, U Microscopic: NEGATIVE
Yeast, UA: NEGATIVE

## 2012-12-18 LAB — POCT URINALYSIS DIPSTICK
Bilirubin, UA: NEGATIVE
Blood, UA: NEGATIVE
Glucose, UA: NEGATIVE
Ketones, UA: NEGATIVE
Nitrite, UA: NEGATIVE
Spec Grav, UA: 1.02
pH, UA: 6.5

## 2012-12-18 MED ORDER — TERBINAFINE HCL 250 MG PO TABS: 250.0000 mg | ORAL_TABLET | Freq: Every day | ORAL | Status: DC

## 2012-12-18 NOTE — Progress Notes (Signed)
  Subjective:    Patient ID: Mary Mcdaniel, female    DOB: [birth year], 37 y.o.   MRN: [id number]  HPI  [name] is a 37 year old female who is here today for a complete physical. She gave birth to her second child, a son, 4 months ago and has been seen by OB/GYN for pap/breast exams this year. She has no complaints today other than a toenail discoloration that appeared during the end of her pregnancy. She denies itch, discharge, or pain. It started on her 5th digit nail and has since spread to all nails on her right foot. She does not recall any trauma to the foot or nails.The left foot is unaffected. She notes that she is now starting to see a slight discoloration on her right nails. She has not tried anything for the discoloration. She is currently taking a prenatal vitamin and has the Mirena IUD. She has taken the prenatal vitamin in the past without complications.   Review of Systems negative    Objective:   Physical Exam  Constitutional: She is oriented to person, place, and time. She appears well-developed and well-nourished.  HENT:  Head: Normocephalic and atraumatic.  Right Ear: Tympanic membrane, external ear and ear canal normal. No decreased hearing is noted.  Left Ear: Tympanic membrane, external ear and ear canal normal. No decreased hearing is noted.  Nose: Nose normal.  Mouth/Throat: Uvula is midline, oropharynx is clear and moist and mucous membranes are normal.  Eyes: Conjunctivae, EOM and lids are normal. Pupils are equal, round, and reactive to light.  Fundoscopic exam:      The right eye shows no arteriolar narrowing and no AV nicking.       The left eye shows no arteriolar narrowing and no AV nicking.  Neck: Trachea normal, normal range of motion and full passive range of motion without pain. No mass present.  Cardiovascular: Normal rate, regular rhythm, S1 normal, S2 normal, intact distal pulses and normal pulses.   Pulmonary/Chest: Effort normal and breath  sounds normal.  Musculoskeletal: Normal range of motion.  Lymphadenopathy:    She has no cervical adenopathy.  Neurological: She is alert and oriented to person, place, and time. She has normal strength and normal reflexes. No cranial nerve deficit or sensory deficit. Coordination and gait normal.  Skin: Skin is warm and dry. She is not diaphoretic.  No abnormal lesions or growths noted. Nails on the RIGHT foot are hypertrophic and discolored, consistent with onychomycosis.  Nails on the RIGHT hand are noted to have a hyperpigmented streak laterally.  Psychiatric: She has a normal mood and affect.    BP 150/82  Pulse 73  Temp(Src) 98.2 F (36.8 C) (Oral)  Resp 16  Ht 5\' 3"  (1.6 m)  Wt 238 lb (107.956 kg)  BMI 42.17 kg/m2  SpO2 99%  LMP 12/04/2012  Breastfeeding? No    Assessment & Plan:  Routine general medical examination at a health care facility - Plan: POCT UA - Microscopic Only, POCT urinalysis dipstick, Comprehensive metabolic panel, Lipid panel, TSH, CBC with Differential  Need for influenza vaccination - Plan: Flu Vaccine QUAD 36+ mos IM  Onychomycosis - Plan: terbinafine (LAMISIL) 250 MG tablet for 90 days. Recheck liver function in 1 month  Elevated BP reading today. Monitor BP 2-3 times per week. If running consistently in 140s/90s, let us know.

## 2012-12-18 NOTE — Patient Instructions (Addendum)
Check your blood pressure 2-3 times weekly and record the results.  If it's consistently running >140/90, please let me know.  I will contact you with your lab results as soon as they are available.   If you have not heard from me in 2 weeks, please contact me.  The fastest way to get your results is to register for My Chart (see the instructions on the last page of this printout).  Keeping You Healthy  Get These Tests 1. Blood Pressure- Have your blood pressure checked once a year by your health care provider.  Normal blood pressure is 120/80. 2. Weight- Have your body mass index (BMI) calculated to screen for obesity.  BMI is measure of body fat based on height and weight.  You can also calculate your own BMI at https://www.west-esparza.com/. 3. Cholesterol- Have your cholesterol checked every 5 years starting at age 78 then yearly starting at age 70. 4. Chlamydia, HIV, and other sexually transmitted diseases- Get screened every year until age 16, then within three months of each new sexual provider. 5. Pap Smear- Every 1-3 years; discuss with your health care provider. 6. Mammogram- Every year starting at age 66  Take these medicines  Calcium with Vitamin D-Your body needs 1200 mg of Calcium each day and [phone] IU of Vitamin D daily.  Your body can only absorb 500 mg of Calcium at a time so Calcium must be taken in 2 or 3 divided doses throughout the day.  Multivitamin with folic acid- Once daily if it is possible for you to become pregnant.  Get these Immunizations  Gardasil-Series of three doses; prevents HPV related illness such as genital warts and cervical cancer.  Menactra-Single dose; prevents meningitis.  Tetanus shot- Every 10 years.  Flu shot-Every year.  Take these steps 1. Do not smoke-Your healthcare provider can help you quit.  For tips on how to quit go to www.smokefree.gov or call 1-800 QUITNOW. 2. Be physically active- Exercise 5 days a week for at least 30  minutes.  If you are not already physically active, start slow and gradually work up to 30 minutes of moderate physical activity.  Examples of moderate activity include walking briskly, dancing, swimming, bicycling, etc. 3. Breast Cancer- A self breast exam every month is important for early detection of breast cancer.  For more information and instruction on self breast exams, ask your healthcare provider or [place]. 4. Eat a healthy diet- Eat a variety of healthy foods such as fruits, vegetables, whole grains, low fat milk, low fat cheeses, yogurt, lean meats, poultry and fish, beans, nuts, tofu, etc.  For more information go to www. Thenutritionsource.org 5. Drink alcohol in moderation- Limit alcohol intake to one drink or less per day. Never drink and drive. 6. Depression- Your emotional health is as important as your physical health.  If you're feeling down or losing interest in things you normally enjoy please talk to your healthcare provider about being screened for depression. 7. Dental visit- Brush and floss your teeth twice daily; visit your dentist twice a year. 8. Eye doctor- Get an eye exam at least every 2 years. 9. Helmet use- Always wear a helmet when riding a bicycle, motorcycle, rollerblading or skateboarding. 10. Safe sex- If you may be exposed to sexually transmitted infections, use a condom. 11. Seat belts- Seat belts can save your live; always wear one. 12. Smoke/Carbon Monoxide detectors- These detectors need to be installed on the appropriate level of your home. Replace batteries at  least once a year. 13. Skin cancer- When out in the sun please cover up and use sunscreen 15 SPF or higher. 14. Violence- If anyone is threatening or hurting you, please tell your healthcare provider.

## 2012-12-18 NOTE — Progress Notes (Signed)
I have examined this patient along with the student and agree.  

## 2012-12-18 NOTE — Progress Notes (Signed)
  Subjective:    Patient ID: Mary Mcdaniel, female    DOB: 11-14-1975, 37 y.o.   MRN: [id number]  HPI    Review of Systems  Constitutional: Negative.   HENT: Negative.   Eyes: Negative.   Respiratory: Negative.   Cardiovascular: Negative.   Endocrine: Negative.   Genitourinary: Negative.   Musculoskeletal: Negative.   Skin: Negative.   Neurological: Negative.   Hematological: Negative.   Psychiatric/Behavioral: Negative.        Objective:   Physical Exam        Assessment & Plan:

## 2012-12-19 LAB — TSH: TSH: 0.145 u[IU]/mL — ABNORMAL LOW (ref 0.350–4.500)

## 2012-12-23 ENCOUNTER — Encounter: Payer: Self-pay | Admitting: Physician Assistant

## 2012-12-23 ENCOUNTER — Telehealth: Payer: Self-pay

## 2012-12-23 NOTE — Telephone Encounter (Signed)
PA required for terbinafine. Began PA form on http://www.gross.com/ and they require that the [place] be confirmed by KOH. Chelle, do you want to get pt back in for the KOH?

## 2012-12-23 NOTE — Addendum Note (Signed)
Addended by: Fernande Bras on: 12/23/2012 09:19 AM   Modules accepted: Orders

## 2012-12-23 NOTE — Telephone Encounter (Signed)
Please cancel this Rx at [facility].  Rx was printed and patient instructed to take it to [facility], where she can get it for $4/month, $10/90-days if she pays out of pocket, no prior authorization, KOH, etc required.Marland Kitchen

## 2012-12-24 NOTE — Telephone Encounter (Signed)
Notice to cancel Rx faxed back to [facility] ([employer] ins).

## 2013-01-22 ENCOUNTER — Other Ambulatory Visit (INDEPENDENT_AMBULATORY_CARE_PROVIDER_SITE_OTHER): Payer: BC Managed Care – PPO | Admitting: *Deleted

## 2013-01-22 DIAGNOSIS — Z79899 Other long term (current) drug therapy: Secondary | ICD-10-CM

## 2013-01-22 DIAGNOSIS — R7989 Other specified abnormal findings of blood chemistry: Secondary | ICD-10-CM

## 2013-01-22 DIAGNOSIS — R946 Abnormal results of thyroid function studies: Secondary | ICD-10-CM

## 2013-01-22 LAB — AST: AST: 10 U/L (ref 0–37)

## 2013-01-22 NOTE — Progress Notes (Signed)
Patient is here for lab draw only

## 2013-01-23 LAB — T3, FREE: T3, Free: 3 pg/mL (ref 2.3–4.2)

## 2013-01-23 LAB — T4, FREE: Free T4: 1.03 ng/dL (ref 0.80–1.80)

## 2013-01-23 NOTE — Addendum Note (Signed)
Addended by: Fernande Bras on: 01/23/2013 08:58 AM   Modules accepted: Orders

## 2014-01-18 ENCOUNTER — Encounter: Payer: Self-pay | Admitting: Physician Assistant

## 2014-04-21 ENCOUNTER — Ambulatory Visit (INDEPENDENT_AMBULATORY_CARE_PROVIDER_SITE_OTHER): Payer: BC Managed Care – PPO | Admitting: Physician Assistant

## 2014-04-21 ENCOUNTER — Other Ambulatory Visit: Payer: Self-pay | Admitting: Physician Assistant

## 2014-04-21 VITALS — BP 124/90 | HR 101 | Temp 98.0°F | Resp 16 | Ht 62.75 in | Wt 243.8 lb

## 2014-04-21 DIAGNOSIS — Z13220 Encounter for screening for lipoid disorders: Secondary | ICD-10-CM

## 2014-04-21 DIAGNOSIS — Z130 Encounter for screening for diseases of the blood and blood-forming organs and certain disorders involving the immune mechanism: Secondary | ICD-10-CM

## 2014-04-21 DIAGNOSIS — Z0000 Encounter for general adult medical examination without abnormal findings: Secondary | ICD-10-CM

## 2014-04-21 DIAGNOSIS — Z23 Encounter for immunization: Secondary | ICD-10-CM | POA: Diagnosis not present

## 2014-04-21 DIAGNOSIS — Z1389 Encounter for screening for other disorder: Secondary | ICD-10-CM

## 2014-04-21 DIAGNOSIS — R748 Abnormal levels of other serum enzymes: Secondary | ICD-10-CM

## 2014-04-21 DIAGNOSIS — Z139 Encounter for screening, unspecified: Secondary | ICD-10-CM | POA: Diagnosis not present

## 2014-04-21 LAB — POCT CBC
Granulocyte percent: 59.7 %G (ref 37–80)
HCT, POC: 37.5 % — AB (ref 37.7–47.9)
Hemoglobin: 11.5 g/dL — AB (ref 12.2–16.2)
Lymph, poc: 2.6 (ref 0.6–3.4)
MCH, POC: 22.6 pg — AB (ref 27–31.2)
MCHC: 30.6 g/dL — AB (ref 31.8–35.4)
MCV: 73.8 fL — AB (ref 80–97)
MID (CBC): 0.3 (ref 0–0.9)
MPV: 7.8 fL (ref 0–99.8)
PLATELET COUNT, POC: 278 10*3/uL (ref 142–424)
POC GRANULOCYTE: 4.2 (ref 2–6.9)
POC LYMPH PERCENT: 36.3 %L (ref 10–50)
POC MID %: 4 %M (ref 0–12)
RBC: 5.08 M/uL (ref 4.04–5.48)
RDW, POC: 13.6 %
WBC: 7.1 10*3/uL (ref 4.6–10.2)

## 2014-04-21 LAB — LIPID PANEL
CHOLESTEROL: 172 mg/dL (ref 0–200)
HDL: 43 mg/dL (ref >39)
LDL CALC: 110 mg/dL — AB (ref 0–99)
Total CHOL/HDL Ratio: 4 Ratio
Triglycerides: 93 mg/dL (ref <150)
VLDL: 19 mg/dL (ref 0–40)

## 2014-04-21 LAB — COMPREHENSIVE METABOLIC PANEL
ALT: <8 U/L (ref 0–35)
AST: 10 U/L (ref 0–37)
Albumin: 4.1 g/dL (ref 3.5–5.2)
Alkaline Phosphatase: 144 U/L — ABNORMAL HIGH (ref 39–117)
BUN: 10 mg/dL (ref 6–23)
CALCIUM: 9 mg/dL (ref 8.4–10.5)
CO2: 25 meq/L (ref 19–32)
CREATININE: 0.58 mg/dL (ref 0.50–1.10)
Chloride: 103 mEq/L (ref 96–112)
Glucose, Bld: 99 mg/dL (ref 70–99)
POTASSIUM: 4.1 meq/L (ref 3.5–5.3)
Sodium: 140 mEq/L (ref 135–145)
TOTAL PROTEIN: 7.7 g/dL (ref 6.0–8.3)
Total Bilirubin: 0.4 mg/dL (ref 0.2–1.2)

## 2014-04-21 LAB — POCT UA - MICROSCOPIC ONLY
BACTERIA, U MICROSCOPIC: 0
CASTS, UR, LPF, POC: 0
Crystals, Ur, HPF, POC: 0
Mucus, UA: 0
RBC, urine, microscopic: 0
Yeast, UA: 0

## 2014-04-21 NOTE — Progress Notes (Signed)
Subjective:    Patient ID: Mary Mcdaniel, female    DOB: Mar 07, 1976, 39 y.o.   MRN: [id number]   PCP: Kahlie Deutscher, PA-C  Chief Complaint  Patient presents with  . Annual Exam    No Known Allergies  There are no active problems to display for this patient.   Prior to Admission medications   Medication Sig Start Date End Date Taking? Authorizing Provider  levonorgestrel (MIRENA) 20 MCG/24HR IUD 1 each by Intrauterine route once.    Historical Provider, MD     Past Medical History  Diagnosis Date  . Environmental allergies   . Allergy   . Anemia   . Hypertension      Past Surgical History  Procedure Laterality Date  . Breast surgery      reduction    Family History  Problem Relation Age of Onset  . Cancer Mother     breast  . Hypertension Mother   . Macular degeneration Mother   . Hypertension Father   . Hyperlipidemia Father   . Cancer Maternal Grandfather     lung  . Diabetes Paternal Grandmother   . Hypertension Maternal Grandmother     History   Social History  . Marital Status: Married    Spouse Name: Aleana Fifita    Number of Children: 2  . 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 Smoking status: Never Smoker   . Smokeless tobacco: Never Used  . Alcohol Use: No  . Drug Use: No  . Sexual Activity:    Partners: Male    Birth Control/ Protection: IUD     Comment: Mirena 08/2012   Other Topics Concern  . None   Social History Narrative   Married to husband Alycia Rossetti, two biological kids, daughter and son, and a foster daughter they are in the process of adopting.    HPI  Presents for annual exam. I last saw her 12/2012. GYN for breast and cervical cancer screening, etc. Has not yet had a flu vaccine.  Review of Systems  Constitutional: Negative.   HENT: Negative.   Eyes: Negative.   Respiratory: Negative.   Cardiovascular: Negative.   Gastrointestinal:  Negative.   Genitourinary: Negative.   Musculoskeletal: Negative.   Skin: Negative.   Neurological: Negative.   Psychiatric/Behavioral: Negative.        Objective:   Physical Exam  Constitutional: She is oriented to person, place, and time. Vital signs are normal. She appears well-developed and well-nourished. She is active and cooperative. No distress.  BP 124/90 mmHg  Pulse 101  Temp(Src) 98 F (36.7 C) (Oral)  Resp 16  Ht 5' 2.75" (1.594 m)  Wt 243 lb 12.8 oz (110.587 kg)  BMI 43.52 kg/m2  SpO2 100%  LMP 04/16/2014   HENT:  Head: Normocephalic and atraumatic.  Right Ear: Hearing, tympanic membrane, external ear and ear canal normal. No foreign bodies.  Left Ear: Hearing, tympanic membrane, external ear and ear canal normal. No foreign bodies.  Nose: Nose normal.  Mouth/Throat: Uvula is midline, oropharynx is clear and moist and mucous membranes are normal. No oral lesions. Normal dentition. No dental abscesses or uvula swelling. No oropharyngeal exudate.  Eyes: Conjunctivae, EOM and lids are normal. Pupils are equal, round, and reactive to light. Right eye exhibits no discharge. Left eye exhibits no discharge. No scleral icterus.  Fundoscopic exam:      The right eye shows  no arteriolar narrowing, no AV nicking, no exudate, no hemorrhage and no papilledema. The right eye shows red reflex.       The left eye shows no arteriolar narrowing, no AV nicking, no exudate, no hemorrhage and no papilledema. The left eye shows red reflex.  Visual Acuity in Right Eye - Without correction:   With correction: 20/13 Visual Acuity in Left Eye - Without correction:   With correction: 20/13 Visual Acuity in Both Eyes - Without correction:   With correction: 20/13    Neck: Trachea normal, normal range of motion and full passive range of motion without pain. Neck supple. No spinous process tenderness and no muscular tenderness present. No thyroid mass and no thyromegaly present.    Cardiovascular: Normal rate, regular rhythm, normal heart sounds, intact distal pulses and normal pulses.   Pulmonary/Chest: Effort normal and breath sounds normal.  Musculoskeletal: She exhibits no edema or tenderness.       Cervical back: Normal.       Thoracic back: Normal.       Lumbar back: Normal.  Lymphadenopathy:       Head (right side): No tonsillar, no preauricular, no posterior auricular and no occipital adenopathy present.       Head (left side): No tonsillar, no preauricular, no posterior auricular and no occipital adenopathy present.    She has no cervical adenopathy.       Right: No supraclavicular adenopathy present.       Left: No supraclavicular adenopathy present.  Neurological: She is alert and oriented to person, place, and time. She has normal strength and normal reflexes. No cranial nerve deficit. She exhibits normal muscle tone. Coordination and gait normal.  Skin: Skin is warm, dry and intact. No rash noted. She is not diaphoretic. No cyanosis or erythema. Nails show no clubbing.  Psychiatric: She has a normal mood and affect. Her speech is normal and behavior is normal. Judgment and thought content normal.   Results for orders placed or performed in visit on 04/21/14  POCT CBC  Result Value Ref Range   WBC 7.1 4.6 - 10.2 K/uL   Lymph, poc 2.6 0.6 - 3.4   POC LYMPH PERCENT 36.3 10 - 50 %L   MID (cbc) 0.3 0 - 0.9   POC MID % 4.0 0 - 12 %M   POC Granulocyte 4.2 2 - 6.9   Granulocyte percent 59.7 37 - 80 %G   RBC 5.08 4.04 - 5.48 M/uL   Hemoglobin 11.5 (A) 12.2 - 16.2 g/dL   HCT, POC 95.6 (A) 21.3 - 47.9 %   MCV 73.8 (A) 80 - 97 fL   MCH, POC 22.6 (A) 27 - 31.2 pg   MCHC 30.6 (A) 31.8 - 35.4 g/dL   RDW, POC 08.6 %   Platelet Count, POC 278 142 - 424 K/uL   MPV 7.8 0 - 99.8 fL  POCT UA - Microscopic Only  Result Value Ref Range   WBC, Ur, HPF, POC 0-1    RBC, urine, microscopic 0    Bacteria, U Microscopic 0    Mucus, UA 0    Epithelial cells, urine per  micros 0-3    Crystals, Ur, HPF, POC 0    Casts, Ur, LPF, POC 0    Yeast, UA 0           Assessment & Plan:  1. Annual physical exam Age appropriate anticipatory guidance provided.  2. Severe obesity (BMI >= 40) She's started exercising with  a buddy. Goal is to be less than 200 lbs by the end of the year. - Comprehensive metabolic panel - TSH  3. Need for influenza vaccination - Flu Vaccine QUAD 36+ mos IM  4. Screening for deficiency anemia Mild anemia. Just ended her menstrual bleed. - POCT CBC  5. Screening for hyperlipidemia Await labs. Healthy lifestyle changes. - Lipid panel  6. Screening for hematuria or proteinuria Normal urine specimen today. - POCT UA - Microscopic Only - POCT urinalysis dipstick   Fernande Brashelle S. Lovelle Lema, PA-C Physician Assistant-Certified [facility]

## 2014-04-21 NOTE — Patient Instructions (Signed)
I will contact you with your lab results as soon as they are available.   If you have not heard from me in 2 weeks, please contact me.  The fastest way to get your results is to register for My Chart (see the instructions on the last page of this printout).  Keeping You Healthy  Get These Tests 1. Blood Pressure- Have your blood pressure checked once a year by your health care provider.  Normal blood pressure is 120/80. 2. Weight- Have your body mass index (BMI) calculated to screen for obesity.  BMI is measure of body fat based on height and weight.  You can also calculate your own BMI at www.nhlbisupport.com/bmi/. 3. Cholesterol- Have your cholesterol checked every 5 years starting at age 20 then yearly starting at age 45. 4. Chlamydia, HIV, and other sexually transmitted diseases- Get screened every year until age 25, then within three months of each new sexual provider. 5. Pap Smear- Every 1-3 years; discuss with your health care provider. 6. Mammogram- Every year starting at age 40  Take these medicines  Calcium with Vitamin D-Your body needs 1200 mg of Calcium each day and 800-1000 IU of Vitamin D daily.  Your body can only absorb 500 mg of Calcium at a time so Calcium must be taken in 2 or 3 divided doses throughout the day.  Multivitamin with folic acid- Once daily if it is possible for you to become pregnant.  Get these Immunizations  Gardasil-Series of three doses; prevents HPV related illness such as genital warts and cervical cancer.  Menactra-Single dose; prevents meningitis.  Tetanus shot- Every 10 years.  Flu shot-Every year.  Take these steps 1. Do not smoke-Your healthcare provider can help you quit.  For tips on how to quit go to www.smokefree.gov or call 1-800 QUITNOW. 2. Be physically active- Exercise 5 days a week for at least 30 minutes.  If you are not already physically active, start slow and gradually work up to 30 minutes of moderate physical activity.   Examples of moderate activity include walking briskly, dancing, swimming, bicycling, etc. 3. Breast Cancer- A self breast exam every month is important for early detection of breast cancer.  For more information and instruction on self breast exams, ask your healthcare provider or www.womenshealth.gov/faq/breast-self-exam.cfm. 4. Eat a healthy diet- Eat a variety of healthy foods such as fruits, vegetables, whole grains, low fat milk, low fat cheeses, yogurt, lean meats, poultry and fish, beans, nuts, tofu, etc.  For more information go to www. Thenutritionsource.org 5. Drink alcohol in moderation- Limit alcohol intake to one drink or less per day. Never drink and drive. 6. Depression- Your emotional health is as important as your physical health.  If you're feeling down or losing interest in things you normally enjoy please talk to your healthcare provider about being screened for depression. 7. Dental visit- Brush and floss your teeth twice daily; visit your dentist twice a year. 8. Eye doctor- Get an eye exam at least every 2 years. 9. Helmet use- Always wear a helmet when riding a bicycle, motorcycle, rollerblading or skateboarding. 10. Safe sex- If you may be exposed to sexually transmitted infections, use a condom. 11. Seat belts- Seat belts can save your live; always wear one. 12. Smoke/Carbon Monoxide detectors- These detectors need to be installed on the appropriate level of your home. Replace batteries at least once a year. 13. Skin cancer- When out in the sun please cover up and use sunscreen 15 SPF or higher.   14. Violence- If anyone is threatening or hurting you, please tell your healthcare provider.        

## 2014-04-22 LAB — TSH: TSH: 1.151 u[IU]/mL (ref 0.350–4.500)

## 2014-04-25 ENCOUNTER — Encounter: Payer: Self-pay | Admitting: Physician Assistant

## 2014-04-27 NOTE — Addendum Note (Signed)
Addended by: Johnnette LitterARDWELL, Mahad Newstrom M on: 04/27/2014 08:29 AM   Modules accepted: Kipp BroodSmartSet

## 2014-04-29 LAB — MITOCHONDRIAL ANTIBODIES: Mitochondrial M2 Ab, IgG: 0.44 (ref <0.91)

## 2014-05-13 ENCOUNTER — Encounter: Payer: BC Managed Care – PPO | Admitting: Physician Assistant

## 2014-06-10 ENCOUNTER — Encounter: Payer: BC Managed Care – PPO | Admitting: Physician Assistant

## 2014-11-23 ENCOUNTER — Other Ambulatory Visit: Payer: Self-pay | Admitting: Obstetrics and Gynecology

## 2014-11-24 LAB — CYTOLOGY - PAP

## 2015-04-26 ENCOUNTER — Encounter: Payer: Self-pay | Admitting: Physician Assistant

## 2015-04-26 ENCOUNTER — Ambulatory Visit (INDEPENDENT_AMBULATORY_CARE_PROVIDER_SITE_OTHER): Payer: BC Managed Care – PPO | Admitting: Physician Assistant

## 2015-04-26 VITALS — BP 110/88 | HR 93 | Temp 98.3°F | Resp 16 | Ht 63.75 in | Wt 256.4 lb

## 2015-04-26 DIAGNOSIS — J069 Acute upper respiratory infection, unspecified: Secondary | ICD-10-CM | POA: Diagnosis not present

## 2015-04-26 DIAGNOSIS — Z23 Encounter for immunization: Secondary | ICD-10-CM

## 2015-04-26 DIAGNOSIS — D649 Anemia, unspecified: Secondary | ICD-10-CM

## 2015-04-26 DIAGNOSIS — B9789 Other viral agents as the cause of diseases classified elsewhere: Secondary | ICD-10-CM

## 2015-04-26 DIAGNOSIS — Z0000 Encounter for general adult medical examination without abnormal findings: Secondary | ICD-10-CM | POA: Diagnosis not present

## 2015-04-26 DIAGNOSIS — D509 Iron deficiency anemia, unspecified: Secondary | ICD-10-CM | POA: Insufficient documentation

## 2015-04-26 LAB — COMPREHENSIVE METABOLIC PANEL
ALT: 9 U/L (ref 6–29)
AST: 12 U/L (ref 10–30)
Albumin: 4 g/dL (ref 3.6–5.1)
Alkaline Phosphatase: 126 U/L — ABNORMAL HIGH (ref 33–115)
BUN: 10 mg/dL (ref 7–25)
CHLORIDE: 103 mmol/L (ref 98–110)
CO2: 22 mmol/L (ref 20–31)
CREATININE: 0.64 mg/dL (ref 0.50–1.10)
Calcium: 8.8 mg/dL (ref 8.6–10.2)
Glucose, Bld: 91 mg/dL (ref 65–99)
POTASSIUM: 3.9 mmol/L (ref 3.5–5.3)
SODIUM: 137 mmol/L (ref 135–146)
Total Bilirubin: 0.4 mg/dL (ref 0.2–1.2)
Total Protein: 7.4 g/dL (ref 6.1–8.1)

## 2015-04-26 LAB — LIPID PANEL
CHOL/HDL RATIO: 3.9 ratio (ref <=5.0)
CHOLESTEROL: 179 mg/dL (ref 125–200)
HDL: 46 mg/dL (ref >=46)
LDL Cholesterol: 118 mg/dL (ref <130)
Triglycerides: 74 mg/dL (ref <150)
VLDL: 15 mg/dL (ref <30)

## 2015-04-26 LAB — CBC WITH DIFFERENTIAL/PLATELET
BASOS ABS: 0 10*3/uL (ref 0.0–0.1)
BASOS PCT: 0 % (ref 0–1)
EOS ABS: 0.1 10*3/uL (ref 0.0–0.7)
EOS PCT: 1 % (ref 0–5)
HCT: 36.8 % (ref 36.0–46.0)
Hemoglobin: 11.5 g/dL — ABNORMAL LOW (ref 12.0–15.0)
LYMPHS PCT: 29 % (ref 12–46)
Lymphs Abs: 2.2 10*3/uL (ref 0.7–4.0)
MCH: 22.8 pg — ABNORMAL LOW (ref 26.0–34.0)
MCHC: 31.3 g/dL (ref 30.0–36.0)
MCV: 72.9 fL — ABNORMAL LOW (ref 78.0–100.0)
MONO ABS: 0.5 10*3/uL (ref 0.1–1.0)
MPV: 10.7 fL (ref 8.6–12.4)
Monocytes Relative: 6 % (ref 3–12)
Neutro Abs: 4.9 10*3/uL (ref 1.7–7.7)
Neutrophils Relative %: 64 % (ref 43–77)
PLATELETS: 264 10*3/uL (ref 150–400)
RBC: 5.05 MIL/uL (ref 3.87–5.11)
RDW: 14.7 % (ref 11.5–15.5)
WBC: 7.6 10*3/uL (ref 4.0–10.5)

## 2015-04-26 LAB — TSH: TSH: 1.69 mIU/L

## 2015-04-26 NOTE — Progress Notes (Signed)
Subjective:    Patient ID: Mary Mcdaniel, female    DOB: [birth year], 40 y.o.   MRN: [id number]  Chief Complaint  Patient presents with  . Annual Exam    HPI Presents today for CPE.   Knows she needs to be more active.   Currently having some sinus congestion, and pressure with associated productive cough with yellow phelgm. Started Friday. Tried multiple Advil cold and sinus with relief. Used to take claratin which she stated helped.   Eye exam on December 31st.   About 2 weeks ago she was having sharp/dull pains in her left chest underneath her breast. Not sure what caused it. Relieved with Ibuprofen. Has not recurred again.   Wants to get more sleep. No difficulty falling asleep or staying asleep.   In the process of adopting a foster child last April (2016), foster daughter for 4 years. 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   Last seen in office on 04/21/14.    GYN for breast and cervical cancer screening.  Has not received influenza vaccine this year. Interested in getting one.   Review of Systems  Constitutional: Negative.   HENT: Positive for congestion, sinus pressure and sore throat. Negative for ear pain, hearing loss, rhinorrhea, sneezing and tinnitus.   Eyes: Positive for visual disturbance (hazy). Negative for pain and itching.  Respiratory: Positive for cough. Negative for chest tightness and shortness of breath.   Cardiovascular: Negative for chest pain, palpitations and leg swelling.  Gastrointestinal: Negative.   Endocrine: Negative.   Genitourinary: Negative.   Skin: Negative.   Neurological: Negative.       Objective:   Physical Exam  Constitutional: She appears well-developed and well-nourished. No distress.  BP 110/88 mmHg  Pulse 93  Temp(Src) 98.3 F (36.8 C) (Oral)  Resp 16  Ht 5' 3.75" (1.619 m)  Wt 256 lb 6.4 oz (116.302 kg)  BMI 44.37 kg/m2  SpO2 96%  LMP 03/29/2014   HENT:  Head: Normocephalic and atraumatic.  Right Ear: Hearing,  tympanic membrane, external ear and ear canal normal.  Left Ear: Hearing, tympanic membrane, external ear and ear canal normal.  Nose: Rhinorrhea present. Right sinus exhibits frontal sinus tenderness. Right sinus exhibits no maxillary sinus tenderness. Left sinus exhibits frontal sinus tenderness. Left sinus exhibits no maxillary sinus tenderness.  Mouth/Throat: Uvula is midline and mucous membranes are normal. Posterior oropharyngeal erythema present. No oropharyngeal exudate.  Eyes: Conjunctivae, EOM and lids are normal. Pupils are equal, round, and reactive to light.  Fundoscopic exam:      The right eye shows no arteriolar narrowing, no AV nicking, no exudate, no hemorrhage and no papilledema. The right eye shows red reflex.       The left eye shows no arteriolar narrowing, no AV nicking, no exudate, no hemorrhage and no papilledema. The left eye shows red reflex.  Neck: Normal range of motion. Neck supple. No tracheal deviation present. No thyromegaly present.  Cardiovascular: Normal rate, regular rhythm, normal heart sounds and intact distal pulses.  Exam reveals no gallop and no friction rub.   No murmur heard. Pulmonary/Chest: Effort normal and breath sounds normal.  Lymphadenopathy:       Head (right side): No submental, no submandibular, no tonsillar, no preauricular, no posterior auricular and no occipital adenopathy present.       Head (left side): No submental, no submandibular, no preauricular, no posterior auricular and no occipital adenopathy present.    She has no cervical adenopathy.  Neurological: She  is alert. She has normal strength and normal reflexes.  Reflex Scores:      Bicep reflexes are 2+ on the right side and 2+ on the left side.      Brachioradialis reflexes are 2+ on the right side and 2+ on the left side.      Patellar reflexes are 2+ on the right side and 2+ on the left side.      Achilles reflexes are 2+ on the right side and 2+ on the left side. Strength 5+  upper and lower  Skin: Skin is warm and dry. She is not diaphoretic.  Vitals reviewed.     Assessment & Plan:

## 2015-04-26 NOTE — Progress Notes (Signed)
Subjective:    Patient ID: Mary Mcdaniel, female    DOB: 1975/06/01, 40 y.o.   MRN: [id number]   PCP: Briana Farner, PA-C  Chief Complaint  Patient presents with  . Annual Exam    HPI Presents today for CPE.   Knows she needs to be more active.   Currently having some sinus congestion, and pressure with associated productive cough with yellow phelgm. Started Friday. Tried multiple Advil cold and sinus with relief. Used to take claratin which she stated helped.   Eye exam on December 31st.   About 2 weeks ago she was having sharp/dull pains in her left chest underneath her breast. Not sure what caused it. Relieved with Ibuprofen. Has not recurred again.   Wants to get more sleep. No difficulty falling asleep or staying asleep.   Last seen in office on 04/21/14 for CPE.   GYN for breast and cervical cancer screening.  Has not received influenza vaccine this year. Interested in getting one.    Patient Active Problem List   Diagnosis Date Noted  . Anemia 04/26/2015  . Severe obesity (BMI >= 40) (HCC) 04/21/2014    No Known Allergies   Prior to Admission medications   Medication Sig Start Date End Date Taking? Authorizing Provider  levonorgestrel (MIRENA) 20 MCG/24HR IUD 1 each by Intrauterine route once.   Yes Historical Provider, MD    Family History  Problem Relation Age of Onset  . Cancer Mother     breast  . Hypertension Mother   . Macular degeneration Mother   . Hypertension Father   . Hyperlipidemia Father   . Diabetes Father   . Cancer Maternal Grandfather     lung  . Diabetes Paternal Grandmother   . Hypertension Maternal Grandmother     Social History   Social History  . Marital Status: Married    Spouse Name: Henlee Donovan  . Number of Children: 3  . 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 Smoking status: Never Smoker   . Smokeless tobacco: Never Used    . Alcohol Use: No  . Drug Use: No  . Sexual Activity:    Partners: Male    Birth Control/ Protection: IUD     Comment: Mirena 08/2012   Other Topics Concern  . Not on file   Social History Narrative   Married to husband Alycia Rossetti, two biological kids, daughter Kyung Rudd (2008) and son Alycia Rossetti (2014), and an adopted daughter Jon Gills. Alycia Rossetti also has a daughter, Penni Bombard, from a previous relationship.       Review of Systems  Constitutional: Negative.   HENT: Positive for sinus pressure and sore throat.   Eyes: Negative.   Respiratory: Negative.   Cardiovascular: Negative.   Gastrointestinal: Negative.   Endocrine: Negative.   Genitourinary: Negative.   Musculoskeletal: Negative.   Skin: Negative.   Allergic/Immunologic: Positive for environmental allergies.  Neurological: Negative.   Hematological: Negative.   Psychiatric/Behavioral: Negative.        Objective:   Physical Exam  Constitutional: She is oriented to person, place, and time. Vital signs are normal. She appears well-developed and well-nourished. She is active and cooperative. No distress.  BP 110/88 mmHg  Pulse 93  Temp(Src) 98.3 F (36.8 C) (Oral)  Resp 16  Ht 5' 3.75" (1.619 m)  Wt 256 lb 6.4 oz (116.302 kg)  BMI 44.37 kg/m2  SpO2 96%  LMP 03/29/2014  HENT:  Head: Normocephalic and atraumatic.  Right Ear: Hearing, tympanic membrane, external ear and ear canal normal. No foreign bodies.  Left Ear: Hearing, tympanic membrane, external ear and ear canal normal. No foreign bodies.  Nose: Nose normal.  Mouth/Throat: Uvula is midline, oropharynx is clear and moist and mucous membranes are normal. No oral lesions. Normal dentition. No dental abscesses or uvula swelling. No oropharyngeal exudate.  Eyes: Conjunctivae, EOM and lids are normal. Pupils are equal, round, and reactive to light. Right eye exhibits no discharge. Left eye exhibits no discharge. No scleral icterus.  Fundoscopic exam:      The right eye shows no  arteriolar narrowing, no AV nicking, no exudate, no hemorrhage and no papilledema. The right eye shows red reflex.       The left eye shows no arteriolar narrowing, no AV nicking, no exudate, no hemorrhage and no papilledema. The left eye shows red reflex.  Neck: Trachea normal, normal range of motion and full passive range of motion without pain. Neck supple. No spinous process tenderness and no muscular tenderness present. No thyroid mass and no thyromegaly present.  Cardiovascular: Normal rate, regular rhythm, normal heart sounds, intact distal pulses and normal pulses.   Pulmonary/Chest: Effort normal and breath sounds normal.  Musculoskeletal: She exhibits no edema or tenderness.       Cervical back: Normal.       Thoracic back: Normal.       Lumbar back: Normal.  Lymphadenopathy:       Head (right side): No tonsillar, no preauricular, no posterior auricular and no occipital adenopathy present.       Head (left side): No tonsillar, no preauricular, no posterior auricular and no occipital adenopathy present.    She has no cervical adenopathy.       Right: No supraclavicular adenopathy present.       Left: No supraclavicular adenopathy present.  Neurological: She is alert and oriented to person, place, and time. She has normal strength and normal reflexes. No cranial nerve deficit. She exhibits normal muscle tone. Coordination and gait normal.  Skin: Skin is warm, dry and intact. No rash noted. She is not diaphoretic. No cyanosis or erythema. Nails show no clubbing.  Psychiatric: She has a normal mood and affect. Her speech is normal and behavior is normal. Judgment and thought content normal.          Assessment & Plan:  1. Annual physical exam Age appropriate anticipatory guidance provided.  2. Need for influenza vaccination - Flu Vaccine QUAD 36+ mos IM  3. Viral URI with cough Resolving. No additional treatment needed.  4. Morbid obesity, unspecified obesity type  (HCC) Encouraged healthy eating choices and regular exercise. The healthy habits are more important that the number of pounds. - Comprehensive metabolic panel - Lipid panel - TSH  5. Anemia, unspecified anemia type Long-standing. Has been stable. Update CBC. - CBC with Differential/Platelet   Fernande Bras, PA-C Physician Assistant-Certified [facility]

## 2015-04-26 NOTE — Patient Instructions (Signed)
I will contact you with your lab results as soon as they are available.   If you have not heard from me in 2 weeks, please contact me.  The fastest way to get your results is to register for My Chart (see the instructions on the last page of this printout).  Use OTC Claritin and Mucinex (NOT -D) as needed for your sinus symptoms. If they persist in another 7 days, please let me know.   Keeping You Healthy  Get These Tests 1. Blood Pressure- Have your blood pressure checked once a year by your health care provider.  Normal blood pressure is 120/80. 2. Weight- Have your body mass index (BMI) calculated to screen for obesity.  BMI is measure of body fat based on height and weight.  You can also calculate your own BMI at https://www.west-esparza.com/. 3. Cholesterol- Have your cholesterol checked every 5 years starting at age 48 then yearly starting at age 50. 4. Chlamydia, HIV, and other sexually transmitted diseases- Get screened every year until age 41, then within three months of each new sexual provider. 5. Pap Test - Every 1-5 years; discuss with your health care provider. 6. Mammogram- Every 1-2 years starting at age 89--50  Take these medicines  Calcium with Vitamin D-Your body needs 1200 mg of Calcium each day and [phone] IU of Vitamin D daily.  Your body can only absorb 500 mg of Calcium at a time so Calcium must be taken in 2 or 3 divided doses throughout the day.  Multivitamin with folic acid- Once daily if it is possible for you to become pregnant.  Get these Immunizations  Gardasil-Series of three doses; prevents HPV related illness such as genital warts and cervical cancer.  Menactra-Single dose; prevents meningitis.  Tetanus shot- Every 10 years.  Flu shot-Every year.  Take these steps 1. Do not smoke-Your healthcare provider can help you quit.  For tips on how to quit go to www.smokefree.gov or call 1-800 QUITNOW. 2. Be physically active- Exercise 5 days a week for at  least 30 minutes.  If you are not already physically active, start slow and gradually work up to 30 minutes of moderate physical activity.  Examples of moderate activity include walking briskly, dancing, swimming, bicycling, etc. 3. Breast Cancer- A self breast exam every month is important for early detection of breast cancer.  For more information and instruction on self breast exams, ask your healthcare provider or [email or web address].[employer]. 4. Eat a healthy diet- Eat a variety of healthy foods such as fruits, vegetables, whole grains, low fat milk, low fat cheeses, yogurt, lean meats, poultry and fish, beans, nuts, tofu, etc.  For more information go to www. Thenutritionsource.org 5. Drink alcohol in moderation- Limit alcohol intake to one drink or less per day. Never drink and drive. 6. Depression- Your emotional health is as important as your physical health.  If you're feeling down or losing interest in things you normally enjoy please talk to your healthcare provider about being screened for depression. 7. Dental visit- Brush and floss your teeth twice daily; visit your dentist twice a year. 8. Eye doctor- Get an eye exam at least every 2 years. 9. Helmet use- Always wear a helmet when riding a bicycle, motorcycle, rollerblading or skateboarding. 10. Safe sex- If you may be exposed to sexually transmitted infections, use a condom. 11. Seat belts- Seat belts can save your live; always wear one. 12. Smoke/Carbon Monoxide detectors- These detectors need to be installed on the appropriate level  of your home. Replace batteries at least once a year. 13. Skin cancer- When out in the sun please cover up and use sunscreen 15 SPF or higher. 14. Violence- If anyone is threatening or hurting you, please tell your healthcare provider.

## 2015-04-30 ENCOUNTER — Encounter: Payer: Self-pay | Admitting: Physician Assistant

## 2015-04-30 DIAGNOSIS — R748 Abnormal levels of other serum enzymes: Secondary | ICD-10-CM

## 2016-03-09 ENCOUNTER — Ambulatory Visit (INDEPENDENT_AMBULATORY_CARE_PROVIDER_SITE_OTHER): Payer: BC Managed Care – PPO

## 2016-03-09 ENCOUNTER — Ambulatory Visit (INDEPENDENT_AMBULATORY_CARE_PROVIDER_SITE_OTHER): Payer: BC Managed Care – PPO | Admitting: Physician Assistant

## 2016-03-09 VITALS — BP 124/70 | HR 106 | Temp 99.0°F | Resp 18 | Ht 63.75 in | Wt 255.0 lb

## 2016-03-09 DIAGNOSIS — M25571 Pain in right ankle and joints of right foot: Secondary | ICD-10-CM | POA: Diagnosis not present

## 2016-03-09 MED ORDER — MELOXICAM 15 MG PO TABS: 15.0000 mg | ORAL_TABLET | Freq: Every day | ORAL | 0 refills | Status: DC

## 2016-03-09 NOTE — Progress Notes (Signed)
   Mary Mcdaniel  MRN: [id number] DOB: 01/11/1976  Subjective:  Mary Mcdaniel is a 40 y.o. female seen in office today for a chief complaint of right ankle injury x 1 day.Notes she saw a mouse in her bathroom this morning and ran from it, causing her to invert her ankle. She had immediate pain and swelling. She has been able to bear minimal weight on the ankle. Denies bruising, numbness, and tingling. She has tried ibuprofen with some relief.   Review of Systems  Constitutional: Negative for chills, fatigue and fever.  Gastrointestinal: Negative for abdominal distention.    Patient Active Problem List   Diagnosis Date Noted  . Elevated alkaline phosphatase level 04/30/2015  . Microcytic anemia 04/26/2015  . Severe obesity (BMI >= 40) (HCC) 04/21/2014    Current Outpatient Prescriptions on File Prior to Visit  Medication Sig Dispense Refill  . levonorgestrel (MIRENA) 20 MCG/24HR IUD 1 each by Intrauterine route once.     No current facility-administered medications on file prior to visit.     No Known Allergies   Objective:  BP 124/70 (BP Location: Right Arm, Patient Position: Sitting, Cuff Size: Large)   Pulse (!) 106   Temp 99 F (37.2 C) (Oral)   Resp 18   Ht 5' 3.75" (1.619 m)   Wt 255 lb (115.7 kg)   SpO2 99%   BMI 44.11 kg/m   Physical Exam  Constitutional: She is oriented to person, place, and time and well-developed, well-nourished, and in no distress.  HENT:  Head: Normocephalic and atraumatic.  Eyes: Conjunctivae are normal.  Neck: Normal range of motion.  Pulmonary/Chest: Effort normal.  Musculoskeletal:       Right ankle: She exhibits decreased range of motion (with flextion, extension, inversion, and eversion) and swelling. She exhibits no ecchymosis and normal pulse. Tenderness. Lateral malleolus tenderness found.       Left ankle: Normal.       Right foot: Normal.       Left foot: Normal.  Capillary refill <2 seconds in right toes.  Neurological:  She is alert and oriented to person, place, and time. Gait normal.  Skin: Skin is warm and dry.  Psychiatric: Affect normal.  Vitals reviewed.  Dg Ankle Complete Right  Result Date: 03/09/2016 CLINICAL DATA:  Right ankle injury this morning with pain and swelling. EXAM: RIGHT ANKLE - COMPLETE 3+ VIEW COMPARISON:  None. FINDINGS: Soft tissue swelling is seen about the ankle. No acute bony or joint abnormality is identified. IMPRESSION: Soft tissue swelling without underlying bony abnormality. Electronically Signed   By: Drusilla Kannerhomas  Dalessio M.D.   On: 03/09/2016 16:25     Assessment and Plan :   1. Acute right ankle pain Likely ankle sprain, recommended P.R.I.C.E. Principles. Given educational material for ankle stretches once pain subsides. - DG Ankle Complete Right; Future - meloxicam (MOBIC) 15 MG tablet; Take 1 tablet (15 mg total) by mouth daily.  Dispense: 30 tablet; Refill: 0 -Return to clinic if symptoms worsen, do not improve in one week, or as needed   Benjiman CoreBrittany Deaven Barron PA-C  [facility] 03/09/2016 4:29 PM

## 2016-03-09 NOTE — Patient Instructions (Addendum)
Use ice to affected 4-5 x daily for 20 minutes. Use compression to affected area until swelling subsides. You can take meloxicam daily for inflammation and pain. Begin stretches below once pain has subsided. If no improvement in one week, follow up.   Ankle Sprain, Phase I Rehab Ask your health care provider which exercises are safe for you. Do exercises exactly as told by your health care provider and adjust them as directed. It is normal to feel mild stretching, pulling, tightness, or discomfort as you do these exercises, but you should stop right away if you feel sudden pain or your pain gets worse.Do not begin these exercises until told by your health care provider. Stretching and range of motion exercises These exercises warm up your muscles and joints and improve the movement and flexibility of your lower leg and ankle. These exercises also help to relieve pain and stiffness. Exercise A: Gastroc and soleus stretch 1. Sit on the floor with your left / right leg extended. 2. Loop a belt or towel around the ball of your left / right foot. The ball of your foot is on the walking surface, right under your toes. 3. Keep your left / right ankle and foot relaxed and keep your knee straight while you use the belt or towel to pull your foot toward you. You should feel a gentle stretch behind your calf or knee. 4. Hold this position for __________ seconds, then release to the starting position. Repeat the exercise with your knee bent. You can put a pillow or a rolled bath towel under your knee to support it. You should feel a stretch deep in your calf or at your Achilles tendon. Repeat each stretch __________ times. Complete these stretches __________ times a day. Exercise B: Ankle alphabet 1. Sit with your left / right leg supported at the lower leg.  Do not rest your foot on anything.  Make sure your foot has room to move freely. 2. Think of your left / right foot as a paintbrush, and move your foot  to trace each letter of the alphabet in the air. Keep your hip and knee still while you trace. Make the letters as large as you can without feeling discomfort. 3. Trace every letter from A to Z. Repeat __________ times. Complete this exercise __________ times a day. Strengthening exercises These exercises build strength and endurance in your ankle and lower leg. Endurance is the ability to use your muscles for a long time, even after they get tired. Exercise C: Dorsiflexors 1. Secure a rubber exercise band or tube to an object, such as a table leg, that will stay still when the band is pulled. Secure the other end around your left / right foot. 2. Sit on the floor facing the object, with your left / right leg extended. The band or tube should be slightly tense when your foot is relaxed. 3. Slowly bring your foot toward you, pulling the band tighter. 4. Hold this position for __________ seconds. 5. Slowly return your foot to the starting position. Repeat __________ times. Complete this exercise __________ times a day. Exercise D: Plantar flexors 1. Sit on the floor with your left / right leg extended. 2. Loop a rubber exercise tube or band around the ball of your left / right foot. The ball of your foot is on the walking surface, right under your toes.  Hold the ends of the band or tube in your hands.  The band or tube should be  slightly tense when your foot is relaxed. 3. Slowly point your foot and toes downward, pushing them away from you. 4. Hold this position for __________ seconds. 5. Slowly return your foot to the starting position. Repeat __________ times. Complete this exercise __________ times a day. Exercise E: Evertors 1. Sit on the floor with your legs straight out in front of you. 2. Loop a rubber exercise band or tube around the ball of your left / right foot. The ball of your foot is on the walking surface, right under your toes.  Hold the ends of the band in your hands, or  secure the band to a stable object.  The band or tube should be slightly tense when your foot is relaxed. 3. Slowly push your foot outward, away from your other leg. 4. Hold this position for __________ seconds. 5. Slowly return your foot to the starting position. Repeat __________ times. Complete this exercise __________ times a day. This information is not intended to replace advice given to you by your health care provider. Make sure you discuss any questions you have with your health care provider. Document Released: 10/04/2004 Document Revised: 11/10/2015 Document Reviewed: 01/17/2015 Elsevier Interactive Patient Education  2017 [employer]     IF you received an x-ray today, you will receive an invoice from [facility]. Please contact [facility] at [phone] with questions or concerns regarding your invoice.   IF you received labwork today, you will receive an invoice from [facility]. Please contact [facility] at [phone] with questions or concerns regarding your invoice.   Our billing staff will not be able to assist you with questions regarding bills from these companies.  You will be contacted with the lab results as soon as they are available. The fastest way to get your results is to activate your My Chart account. Instructions are located on the last page of this paperwork. If you have not heard from us regarding the results in 2 weeks, please contact this office.

## 2016-04-05 ENCOUNTER — Other Ambulatory Visit: Payer: Self-pay | Admitting: Physician Assistant

## 2016-04-05 DIAGNOSIS — M25571 Pain in right ankle and joints of right foot: Secondary | ICD-10-CM

## 2016-04-05 NOTE — Telephone Encounter (Signed)
03/09/16

## 2016-04-06 NOTE — Telephone Encounter (Signed)
Could you please call the patient and let her know that this was not meant to be a long term medication. If she is still having pain, she should return to clinic for further evaluation.

## 2016-04-14 NOTE — Telephone Encounter (Signed)
Spoke to pt and she did not request this, cx.

## 2017-04-16 ENCOUNTER — Ambulatory Visit (INDEPENDENT_AMBULATORY_CARE_PROVIDER_SITE_OTHER): Payer: BC Managed Care – PPO | Admitting: Physician Assistant

## 2017-04-16 ENCOUNTER — Encounter: Payer: Self-pay | Admitting: Physician Assistant

## 2017-04-16 ENCOUNTER — Other Ambulatory Visit: Payer: Self-pay

## 2017-04-16 VITALS — BP 124/84 | HR 88 | Temp 98.5°F | Resp 18 | Ht 63.75 in | Wt 257.6 lb

## 2017-04-16 DIAGNOSIS — Z566 Other physical and mental strain related to work: Secondary | ICD-10-CM

## 2017-04-16 DIAGNOSIS — Z1389 Encounter for screening for other disorder: Secondary | ICD-10-CM

## 2017-04-16 DIAGNOSIS — R0981 Nasal congestion: Secondary | ICD-10-CM | POA: Diagnosis not present

## 2017-04-16 DIAGNOSIS — D509 Iron deficiency anemia, unspecified: Secondary | ICD-10-CM | POA: Diagnosis not present

## 2017-04-16 DIAGNOSIS — Z23 Encounter for immunization: Secondary | ICD-10-CM | POA: Diagnosis not present

## 2017-04-16 DIAGNOSIS — Z0000 Encounter for general adult medical examination without abnormal findings: Secondary | ICD-10-CM | POA: Diagnosis not present

## 2017-04-16 DIAGNOSIS — Z1329 Encounter for screening for other suspected endocrine disorder: Secondary | ICD-10-CM

## 2017-04-16 DIAGNOSIS — Z13220 Encounter for screening for lipoid disorders: Secondary | ICD-10-CM

## 2017-04-16 DIAGNOSIS — R748 Abnormal levels of other serum enzymes: Secondary | ICD-10-CM | POA: Diagnosis not present

## 2017-04-16 MED ORDER — FLUTICASONE PROPIONATE 50 MCG/ACT NA SUSP: 2.0000 | Freq: Every day | NASAL | 3 refills | Status: AC

## 2017-04-16 NOTE — Progress Notes (Signed)
Patient ID: Mary Mcdaniel, female    DOB: 1975/06/15, 42 y.o.   MRN: [id number]  PCP: Porfirio Oar, PA-C  Chief Complaint  Patient presents with  . Annual Exam    No pap smear needed.    Subjective:   Presents for [name].   Cervical Cancer Screening: reportedly 11/2016 with GYN Breast Cancer Screening: reportedly 08/2016 Colorectal Cancer Screening: not yet a candidate Bone Density Testing: not yet a candidate HIV Screening: 11/12/2016, NEGATIVE, very low risk STI Screening: declines, very low risk Seasonal Influenza Vaccination: today Td/Tdap Vaccination: 08/20/2012 Pneumococcal Vaccination: not yet a candidate Zoster Vaccination: not yet a candidate Frequency of Dental evaluation: Q6 months Frequency of Eye evaluation: annually   Exercise: walking once a week Diet: More carbs and sugars than should, eats vegetables  Notes sinus headaches and congestion. Claritin, Sudafed, and her husband's Flonase is helpful. Would like her own prescription for Flonase  Had a lot of stress last week. 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 "Lots of drama" last week. Experience neck and shoulder tension with the stress. Icy-Hot and Ibuprofen helped. No prior injury or trauma to her neck or shoulders. Usually handles stress really well, but last week was a lot. Denies any chest pain, palpitations, shortness of breath, or anxious feelings.   Patient Active Problem List   Diagnosis Date Noted  . Elevated alkaline phosphatase level 04/30/2015  . Microcytic anemia 04/26/2015  . Severe obesity (BMI >= 40) (HCC) 04/21/2014    Past Medical History:  Diagnosis Date  . Allergy   . Anemia   . Environmental allergies   . Hypertension      Prior to Admission medications   Medication Sig Start Date End Date Taking? Authorizing Provider  levonorgestrel (MIRENA) 20 MCG/24HR IUD 1 each by Intrauterine route once.   Yes [provider]  loratadine (CLARITIN) 5 MG/5ML syrup  Take by mouth daily.   Yes [provider]  meloxicam (MOBIC) 15 MG tablet Take 1 tablet (15 mg total) by mouth daily. Patient not taking: Reported on 04/16/2017 03/09/16   Benjiman Core D, PA-C    No Known Allergies  Past Surgical History:  Procedure Laterality Date  . BREAST SURGERY     reduction    Family History  Problem Relation Age of Onset  . Cancer Mother        breast  . Hypertension Mother   . Macular degeneration Mother   . Hypertension Father   . Hyperlipidemia Father   . Diabetes Father   . Cancer Maternal Grandfather        lung  . Diabetes Paternal Grandmother   . Hypertension Maternal Grandmother     Social History   Socioeconomic History  . Marital status: Married    Spouse name: Tasheema Perrone  . Number of children: 3  . Years of education: Master's  . Highest education level: None  Social Needs  . Financial resource strain: None  . Food insecurity - worry: None  . Food insecurity - inability: None  . Transportation needs - medical: None  . Transportation needs - non-medical: None  Occupational History  . 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 Smoking status: Never Smoker  . Smokeless tobacco: Never Used  Substance and Sexual Activity  . Alcohol use: No    Alcohol/week: 0.0 oz  . Drug use: No  . Sexual activity: Yes  Partners: Male    Birth control/protection: IUD    Comment: Mirena 08/2012  Other Topics Concern  . None  Social History Narrative   Married to husband Alycia RossettiRyan, two biological kids, daughter Kyung RuddKennedy (2008) and son Alycia RossettiRyan (2014), and an adopted daughter Jon Gillslexis. Alycia RossettiRyan also has a daughter, Penni BombardKendall, from a previous relationship.        Review of Systems  Constitutional: Negative.   HENT: Positive for congestion and sinus pressure. Negative for dental problem, drooling, ear discharge, ear pain, facial swelling, mouth sores, nosebleeds, postnasal drip, rhinorrhea, sinus  pain, sneezing, sore throat, tinnitus, trouble swallowing and voice change.   Eyes: Negative.   Respiratory: Negative.   Cardiovascular: Negative.   Gastrointestinal: Negative.   Genitourinary: Negative.   Musculoskeletal: Negative.   Skin: Negative.   Neurological: Negative.   Psychiatric/Behavioral: Negative.         Objective:  Physical Exam  Constitutional: She is oriented to person, place, and time. She appears well-developed and well-nourished. She is active and cooperative. No distress.  BP 124/84 (BP Location: Left Arm, Patient Position: Sitting, Cuff Size: Large)   Pulse 88   Temp 98.5 F (36.9 C) (Oral)   Resp 18   Ht 5' 3.75" (1.619 m)   Wt 257 lb 9.6 oz (116.8 kg)   SpO2 100%   BMI 44.56 kg/m   HENT:  Head: Normocephalic and atraumatic.  Right Ear: Hearing normal.  Left Ear: Hearing normal.  Eyes: Conjunctivae are normal. No scleral icterus.  Neck: Normal range of motion. Neck supple. No thyromegaly present.  Cardiovascular: Normal rate, regular rhythm and normal heart sounds.  Pulses:      Radial pulses are 2+ on the right side, and 2+ on the left side.  Pulmonary/Chest: Effort normal and breath sounds normal.  Lymphadenopathy:       Head (right side): No tonsillar, no preauricular, no posterior auricular and no occipital adenopathy present.       Head (left side): No tonsillar, no preauricular, no posterior auricular and no occipital adenopathy present.    She has no cervical adenopathy.       Right: No supraclavicular adenopathy present.       Left: No supraclavicular adenopathy present.  Neurological: She is alert and oriented to person, place, and time. No sensory deficit.  Skin: Skin is warm, dry and intact. No rash noted. No cyanosis or erythema. Nails show no clubbing.  Psychiatric: She has a normal mood and affect. Her speech is normal and behavior is normal.     Wt Readings from Last 3 Encounters:  04/16/17 257 lb 9.6 oz (116.8 kg)  03/09/16  255 lb (115.7 kg)  04/26/15 256 lb 6.4 oz (116.3 kg)     Visual Acuity Screening   Right eye Left eye Both eyes  Without correction:     With correction: 20/20 20/20 20/20          Assessment & Plan:   Problem List Items Addressed This Visit    Severe obesity (BMI >= 40) (HCC)    Healthy eating, regular exercise. Not yet ready for medical weight management.      Microcytic anemia    Has been stable. Continue to monitor.      Relevant Orders   CBC with Differential/Platelet (Completed)   Elevated alkaline phosphatase level    Mildly elevated. Continue to monitor.      Relevant Orders   Comprehensive metabolic panel (Completed)   Sinus congestion    Untreated chronic allergies.  Start nasal steroid spray.      Relevant Medications   fluticasone (FLONASE) 50 MCG/ACT nasal spray    Other Visit Diagnoses    Annual physical exam    -  Primary   Age appropriate health guidance provided.   Screening for hyperlipidemia       Relevant Orders   Lipid panel (Completed)   Screening for blood or protein in urine       Relevant Orders   Urinalysis, dipstick only (Completed)   Screening for thyroid disorder       Relevant Orders   TSH (Completed)   Need for influenza vaccination       Relevant Orders   Flu Vaccine QUAD 36+ mos IM (Completed)   Stress at work           Return in about 1 year (around 04/16/2018) for annual physical exam, sooner if needed.   Fernande Bras, PA-C [facility]

## 2017-04-16 NOTE — Patient Instructions (Addendum)
   IF you received an x-ray today, you will receive an invoice from [facility]. Please contact [facility] at [phone] with questions or concerns regarding your invoice.   IF you received labwork today, you will receive an invoice from [facility]. Please contact [facility] at [phone] with questions or concerns regarding your invoice.   Our billing staff will not be able to assist you with questions regarding bills from these companies.  You will be contacted with the lab results as soon as they are available. The fastest way to get your results is to activate your My Chart account. Instructions are located on the last page of this paperwork. If you have not heard from us regarding the results in 2 weeks, please contact this office.     Preventive Care 40-64 Years, Female Preventive care refers to lifestyle choices and visits with your health care provider that can promote health and wellness. What does preventive care include?  A yearly physical exam. This is also called an annual well check.  Dental exams once or twice a year.  Routine eye exams. Ask your health care provider how often you should have your eyes checked.  Personal lifestyle choices, including: ? Daily care of your teeth and gums. ? Regular physical activity. ? Eating a healthy diet. ? Avoiding tobacco and drug use. ? Limiting alcohol use. ? Practicing safe sex. ? Taking low-dose aspirin daily starting at age 50. ? Taking vitamin and mineral supplements as recommended by your health care provider. What happens during an annual well check? The services and screenings done by your health care provider during your annual well check will depend on your age, overall health, lifestyle risk factors, and family history of disease. Counseling Your health care provider may ask you questions about your:  Alcohol use.  Tobacco use.  Drug use.  Emotional well-being.  Home and relationship  well-being.  Sexual activity.  Eating habits.  Work and work environment.  Method of birth control.  Menstrual cycle.  Pregnancy history.  Screening You may have the following tests or measurements:  Height, weight, and BMI.  Blood pressure.  Lipid and cholesterol levels. These may be checked every 5 years, or more frequently if you are over 50 years old.  Skin check.  Lung cancer screening. You may have this screening every year starting at age 55 if you have a 30-pack-year history of smoking and currently smoke or have quit within the past 15 years.  Fecal occult blood test (FOBT) of the stool. You may have this test every year starting at age 50.  Flexible sigmoidoscopy or colonoscopy. You may have a sigmoidoscopy every 5 years or a colonoscopy every 10 years starting at age 50.  Hepatitis C blood test.  Hepatitis B blood test.  Sexually transmitted disease (STD) testing.  Diabetes screening. This is done by checking your blood sugar (glucose) after you have not eaten for a while (fasting). You may have this done every 1-3 years.  Mammogram. This may be done every 1-2 years. Talk to your health care provider about when you should start having regular mammograms. This may depend on whether you have a family history of breast cancer.  BRCA-related cancer screening. This may be done if you have a family history of breast, ovarian, tubal, or peritoneal cancers.  Pelvic exam and Pap test. This may be done every 3 years starting at age 21. Starting at age 30, this may be done every 5 years if you   have a Pap test in combination with an HPV test.  Bone density scan. This is done to screen for osteoporosis. You may have this scan if you are at high risk for osteoporosis.  Discuss your test results, treatment options, and if necessary, the need for more tests with your health care provider. Vaccines Your health care provider may recommend certain vaccines, such  as:  Influenza vaccine. This is recommended every year.  Tetanus, diphtheria, and acellular pertussis (Tdap, Td) vaccine. You may need a Td booster every 10 years.  Varicella vaccine. You may need this if you have not been vaccinated.  Zoster vaccine. You may need this after age 60.  Measles, mumps, and rubella (MMR) vaccine. You may need at least one dose of MMR if you were born in 1957 or later. You may also need a second dose.  Pneumococcal 13-valent conjugate (PCV13) vaccine. You may need this if you have certain conditions and were not previously vaccinated.  Pneumococcal polysaccharide (PPSV23) vaccine. You may need one or two doses if you smoke cigarettes or if you have certain conditions.  Meningococcal vaccine. You may need this if you have certain conditions.  Hepatitis A vaccine. You may need this if you have certain conditions or if you travel or work in places where you may be exposed to hepatitis A.  Hepatitis B vaccine. You may need this if you have certain conditions or if you travel or work in places where you may be exposed to hepatitis B.  Haemophilus influenzae type b (Hib) vaccine. You may need this if you have certain conditions.  Talk to your health care provider about which screenings and vaccines you need and how often you need them. This information is not intended to replace advice given to you by your health care provider. Make sure you discuss any questions you have with your health care provider. Document Released: 04/01/2015 Document Revised: 11/23/2015 Document Reviewed: 01/04/2015 Elsevier Interactive Patient Education  2018 Elsevier Inc.  

## 2017-04-16 NOTE — Progress Notes (Signed)
Subjective:    Patient ID: Mary MackieSamantha Kosh, female    DOB: September 29, 1975, 42 y.o.   MRN: [id number]  Chief Complaint  Patient presents with  . Annual Exam    No pap smear needed.   Last physical exam was 04/26/15. Overall, she feels well and in good health.  Cervical Cancer Screening: 11/22/2016 (Gynecologist; self-reported) Breast Cancer Screening: 08/2016 (self-reported) Colorectal Cancer Screening: not yet a candidate Bone Density Testing: not yet a candidate HIV Screening: 11/13/06, low risk STI Screening: low risk Seasonal Influenza Vaccination: Today Td/Tdap Vaccination: 08/20/2012 Pneumococcal Vaccination: not yet a candidate Zoster Vaccination: not yet a candidate Frequency of Dental evaluation: Q6 months Frequency of Eye evaluation: annually  Exercise: walking once a week Diet: More carbs and sugars than should, eats vegetables  Notes sinus headaches and congestion. Claritin, Sudafed, and her husband's Flonase is helpful. Would like her own prescription for Flonase  Had a lot of stress last week. 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 "Lots of drama" last week. Experience neck and shoulder tension with the stress. Icy-Hot and Ibuprofen helped. No prior injury or trauma to her neck or shoulders. Usually handles stress really well, but last week was a lot. Denies any chest pain, palpitations, shortness of breath, or anxious feelings.  Review of Systems  Constitutional: Negative for chills, fever and unexpected weight change.  HENT: Positive for congestion, hearing loss and sinus pressure.   Eyes: Negative for visual disturbance.  Respiratory: Negative for cough, chest tightness, shortness of breath and wheezing.   Cardiovascular: Negative for chest pain, palpitations and leg swelling.  Gastrointestinal: Negative for abdominal pain, constipation, diarrhea, nausea and vomiting.  Genitourinary: Negative for difficulty urinating, dysuria, frequency and hematuria.  Musculoskeletal: Negative  for gait problem.  Neurological: Negative for dizziness, syncope, weakness, light-headedness and numbness.   Patient Active Problem List   Diagnosis Date Noted  . Sinus congestion 04/16/2017  . Elevated alkaline phosphatase level 04/30/2015  . Microcytic anemia 04/26/2015  . Severe obesity (BMI >= 40) (HCC) 04/21/2014   Prior to Admission medications   Medication Sig Start Date End Date Taking? Authorizing Provider  levonorgestrel (MIRENA) 20 MCG/24HR IUD 1 each by Intrauterine route once.   Yes [provider]  loratadine (CLARITIN) 5 MG/5ML syrup Take by mouth daily.   Yes [provider]  No Known Allergies     Objective:   Physical Exam  Constitutional: She is oriented to person, place, and time. She appears well-developed and well-nourished. No distress.  HENT:  Head: Normocephalic and atraumatic.  Right Ear: Tympanic membrane, external ear and ear canal normal.  Left Ear: Tympanic membrane, external ear and ear canal normal.  Nose: Nose normal.  Mouth/Throat: Uvula is midline and oropharynx is clear and moist. No oropharyngeal exudate.  Eyes: Conjunctivae and EOM are normal. Pupils are equal, round, and reactive to light.  Neck: No JVD present. No thyromegaly present.  Cardiovascular: Normal rate, regular rhythm and normal heart sounds.  Pulses:      Radial pulses are 2+ on the right side, and 2+ on the left side.       Posterior tibial pulses are 2+ on the right side, and 2+ on the left side.  Pulmonary/Chest: Effort normal and breath sounds normal. No respiratory distress.  Abdominal: Normal appearance and bowel sounds are normal. There is no tenderness. There is no rigidity, no rebound and no guarding.  Lymphadenopathy:       Head (right side): No submental, no submandibular, no tonsillar,  no preauricular, no posterior auricular and no occipital adenopathy present.       Head (left side): No submental, no submandibular, no tonsillar, no preauricular, no  posterior auricular and no occipital adenopathy present.       Right cervical: No superficial cervical, no deep cervical and no posterior cervical adenopathy present.      Left cervical: No superficial cervical, no deep cervical and no posterior cervical adenopathy present.  Neurological: She is alert and oriented to person, place, and time. No sensory deficit. Gait normal.  Reflex Scores:      Tricep reflexes are 2+ on the right side and 2+ on the left side.      Bicep reflexes are 2+ on the right side and 2+ on the left side.      Brachioradialis reflexes are 2+ on the right side and 2+ on the left side.      Patellar reflexes are 2+ on the right side and 2+ on the left side.      Achilles reflexes are 2+ on the right side and 2+ on the left side. Skin: Skin is warm and dry.  Psychiatric: She has a normal mood and affect. Her speech is normal and behavior is normal. Thought content normal.    Visual Acuity Screening   Right eye Left eye Both eyes  Without correction:     With correction: 20/20 20/20 20/20       Assessment & Plan:  1. Annual physical exam Age appropriate anticipatory guidance provided.   2. Screening for hyperlipidemia - Lipid panel  3. Screening for blood or protein in urine - Urinalysis, dipstick only  4. Screening for thyroid disorder - TSH  5. Need for influenza vaccination - Flu Vaccine QUAD 36+ mos IM  6. Microcytic anemia Has been stable. Continue to monitor. Await labs. Will adjust treatment as indicated by results.  - CBC with Differential/Platelet  7. Elevated alkaline phosphatase level Has been stable. Continue to monitor. Await labs. Will adjust regimen as indicated by results.  - Comprehensive metabolic panel  8. Severe obesity (BMI >= 40) (HCC) Encouraged healthy eating choices and regular exercise.   9. Sinus congestion Well managed with Claritin, Sudafed, and Flonase.  - fluticasone (FLONASE) 50 MCG/ACT nasal spray; Place 2 sprays into  both nostrils daily.  Dispense: 48 g; Refill: 3  Last audiometry: 08/16/2011 results were normal. No evidence of cerumen impaction. If continued concern about hearing loss, anticipate referral to Audiologist.   Return in about 1 year (around 04/16/2018) for annual physical exam, sooner if needed.  Alfonse Alpers, PA-S

## 2017-04-17 LAB — COMPREHENSIVE METABOLIC PANEL
ALT: 14 IU/L (ref 0–32)
AST: 13 IU/L (ref 0–40)
Albumin/Globulin Ratio: 1.2 (ref 1.2–2.2)
Albumin: 4.2 g/dL (ref 3.5–5.5)
Alkaline Phosphatase: 135 IU/L — ABNORMAL HIGH (ref 39–117)
BUN/Creatinine Ratio: 13 (ref 9–23)
BUN: 8 mg/dL (ref 6–24)
Bilirubin Total: 0.3 mg/dL (ref 0.0–1.2)
CALCIUM: 9.1 mg/dL (ref 8.7–10.2)
CHLORIDE: 101 mmol/L (ref 96–106)
CO2: 21 mmol/L (ref 20–29)
Creatinine, Ser: 0.62 mg/dL (ref 0.57–1.00)
GFR, EST AFRICAN AMERICAN: 130 mL/min/{1.73_m2} (ref >59)
GFR, EST NON AFRICAN AMERICAN: 113 mL/min/{1.73_m2} (ref >59)
GLUCOSE: 89 mg/dL (ref 65–99)
Globulin, Total: 3.4 g/dL (ref 1.5–4.5)
Potassium: 4.3 mmol/L (ref 3.5–5.2)
Sodium: 136 mmol/L (ref 134–144)
TOTAL PROTEIN: 7.6 g/dL (ref 6.0–8.5)

## 2017-04-17 LAB — CBC WITH DIFFERENTIAL/PLATELET
Basophils Absolute: 0 10*3/uL (ref 0.0–0.2)
Basos: 0 %
EOS (ABSOLUTE): 0.1 10*3/uL (ref 0.0–0.4)
Eos: 1 %
Hematocrit: 36.3 % (ref 34.0–46.6)
Hemoglobin: 11.4 g/dL (ref 11.1–15.9)
Immature Grans (Abs): 0 10*3/uL (ref 0.0–0.1)
Immature Granulocytes: 0 %
Lymphocytes Absolute: 2.4 10*3/uL (ref 0.7–3.1)
Lymphs: 37 %
MCH: 23 pg — ABNORMAL LOW (ref 26.6–33.0)
MCHC: 31.4 g/dL — ABNORMAL LOW (ref 31.5–35.7)
MCV: 73 fL — ABNORMAL LOW (ref 79–97)
Monocytes Absolute: 0.5 10*3/uL (ref 0.1–0.9)
Monocytes: 8 %
Neutrophils Absolute: 3.5 10*3/uL (ref 1.4–7.0)
Neutrophils: 54 %
Platelets: 275 10*3/uL (ref 150–379)
RBC: 4.96 x10E6/uL (ref 3.77–5.28)
RDW: 15.1 % (ref 12.3–15.4)
WBC: 6.5 10*3/uL (ref 3.4–10.8)

## 2017-04-17 LAB — URINALYSIS, DIPSTICK ONLY
BILIRUBIN UA: NEGATIVE
GLUCOSE, UA: NEGATIVE
Ketones, UA: NEGATIVE
Leukocytes, UA: NEGATIVE
Nitrite, UA: NEGATIVE
PH UA: 7 (ref 5.0–7.5)
PROTEIN UA: NEGATIVE
Specific Gravity, UA: 1.017 (ref 1.005–1.030)
UUROB: 0.2 mg/dL (ref 0.2–1.0)

## 2017-04-17 LAB — LIPID PANEL
CHOL/HDL RATIO: 3.9 ratio (ref 0.0–4.4)
Cholesterol, Total: 161 mg/dL (ref 100–199)
HDL: 41 mg/dL (ref >39)
LDL Calculated: 101 mg/dL — ABNORMAL HIGH (ref 0–99)
TRIGLYCERIDES: 97 mg/dL (ref 0–149)
VLDL CHOLESTEROL CAL: 19 mg/dL (ref 5–40)

## 2017-04-17 LAB — TSH: TSH: 1.21 u[IU]/mL (ref 0.450–4.500)

## 2017-04-20 NOTE — Assessment & Plan Note (Signed)
Untreated chronic allergies. Start nasal steroid spray.

## 2017-04-20 NOTE — Assessment & Plan Note (Signed)
Has been stable.  Continue to monitor.

## 2017-04-20 NOTE — Assessment & Plan Note (Signed)
Mildly elevated. Continue to monitor.

## 2017-04-20 NOTE — Assessment & Plan Note (Signed)
Healthy eating, regular exercise. Not yet ready for medical weight management.

## 2017-04-21 ENCOUNTER — Other Ambulatory Visit: Payer: Self-pay | Admitting: Physician Assistant

## 2017-04-21 DIAGNOSIS — R319 Hematuria, unspecified: Secondary | ICD-10-CM

## 2017-04-30 ENCOUNTER — Encounter: Payer: Self-pay | Admitting: Neurology

## 2017-04-30 ENCOUNTER — Ambulatory Visit (INDEPENDENT_AMBULATORY_CARE_PROVIDER_SITE_OTHER): Payer: BC Managed Care – PPO | Admitting: Neurology

## 2017-04-30 VITALS — BP 141/94 | HR 88 | Ht 63.75 in | Wt 255.0 lb

## 2017-04-30 DIAGNOSIS — R51 Headache: Secondary | ICD-10-CM | POA: Diagnosis not present

## 2017-04-30 DIAGNOSIS — Z6841 Body Mass Index (BMI) 40.0 and over, adult: Secondary | ICD-10-CM | POA: Diagnosis not present

## 2017-04-30 DIAGNOSIS — R519 Headache, unspecified: Secondary | ICD-10-CM

## 2017-04-30 DIAGNOSIS — H4711 Papilledema associated with increased intracranial pressure: Secondary | ICD-10-CM | POA: Diagnosis not present

## 2017-04-30 DIAGNOSIS — R0683 Snoring: Secondary | ICD-10-CM

## 2017-04-30 NOTE — Patient Instructions (Signed)
You may have a condition called pseudotumor cerebri, which means that there increased fluid pressure around your brain and results in pressure on your eye nerve(s). 1. We will do a brain MRI and orbital (ie eyeball) MRI with and without contrast.  2. We will need for you to have a detailed and formal eye exam, at least every 6 months for now with a full visual field test.  3. We will request a LP (lumbar puncture or spinal tap) with pressure testing and routine fluid testing. We will call you with the results.  4. We may start a medication called diamox to help keep your spinal fluid pressure at bay.  5. As you know, your vision and visual field can be affected. The most serious complication of having pseudotumor cerebri is loss of vision which can be permanent. 6. You may need to be considered for a shunt in the future (to prevent fluid pressure from building up). 7. We will do a sleep study to rule out obstructive sleep apnea.   8. Please work on weight loss, it may help a lot!

## 2017-04-30 NOTE — Progress Notes (Signed)
Subjective:    Patient ID: Mary Mcdaniel is a 42 y.o. female.  HPI     Mary Foley, MD, PhD [facility] [address] [address] [place], [place] [address]  Dear Dr. Lorin Picket,   I saw your patient, Mary Mcdaniel, upon your kind request in my neurologic clinic today for initial consultation of her papilledema, concern for pseudotumor cerebri. Patient is unaccompanied today. 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 She had no particular Sx at the time. She has a Hx of migraines, but these improved after her pregnancies; she has a ten yo and 41 yo. She used to use imitrex, but now uses Advil prn. She has consistently gained weight. 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 I reviewed your office note from 04/24/2017 which you kindly included. Thankfully, she had no significant visual deficits or visual acuity deficit, no change in her glasses Rx for myopia. She does report of recurrent headaches and BMI is in the morbidly obese range of about 40. Her Epworth sleepiness score is 5 out of 24, fatigue score is a female 32. She lives at home with her husband and 2 children. She is a nonsmoker and does not utilize alcohol, drinks caffeine in the form of soda, one to 2 small bottles per day on average. She does not have night to night nocturia but has woken up with a headache. She admits that she does not always get 7-8 hours of sleep, goes to bed late, sometimes after midnight and rise time is around 6. She occasionally takes a nap. Snoring is reportedly loud at times. In the past several years she has perpetually gained weight. She has not worked hard on weight loss she admits.  Her  Past Medical History Is Significant For: Past Medical History:  Diagnosis Date  . Allergy   . Anemia   . Environmental allergies   . Hypertension     Her Past Surgical History Is Significant For: Past Surgical History:  Procedure Laterality Date  . BREAST SURGERY     reduction    Her Family History Is Significant For: Family History  Problem Relation Age of Onset  . Cancer Mother        breast  . Hypertension Mother   . Macular degeneration Mother   . Hypertension Father   . Hyperlipidemia Father   . Diabetes Father   . Cancer Maternal Grandfather        lung  . Diabetes Paternal Grandmother   . Hypertension Maternal Grandmother   . Stroke Maternal Grandmother   . Autism Son     Her Social History Is Significant For: Social History   Socioeconomic History  . Marital status: Married    Spouse name: Taisia Fantini  . Number of children: 3  . Years of education: Master's  . Highest education level: None  Social Needs  . Financial resource strain: None  . Food insecurity - worry: None  . Food insecurity - inability: None  . Transportation needs - medical: None  . Transportation needs - non-medical: None  Occupational History  . 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 Smoking status:  Never Smoker  . Smokeless tobacco: Never Used  Substance and Sexual Activity  . Alcohol use: No    Alcohol/week: 0.0 oz  . Drug use: No  . Sexual activity: Yes    Partners: Male    Birth control/protection: IUD    Comment: Mirena 08/2012  Other Topics Concern  . None  Social History Narrative   Married to husband Alycia Rossetti, two biological kids, daughter Kyung Rudd (2008) and son Alycia Rossetti (2014), and an adopted daughter Jon Gills. Alycia Rossetti also has a daughter, Penni Bombard, from a previous relationship.     Her Allergies Are:  No Known Allergies:   Her Current Medications Are:  Outpatient Encounter Medications as of 04/30/2017  Medication Sig  .  fluticasone (FLONASE) 50 MCG/ACT nasal spray Place 2 sprays into both nostrils daily.  Marland Kitchen levonorgestrel (MIRENA) 20 MCG/24HR IUD 1 each by Intrauterine route once.  . loratadine (CLARITIN) 5 MG/5ML syrup Take by mouth daily.   No facility-administered encounter medications on file as of 04/30/2017.   :  Review of Systems:  Out of a complete 14 point review of systems, all are reviewed and negative with the exception of these symptoms as listed below:  Review of Systems  Neurological:       Patient denies any vision changes. She does report occasional headaches, and that she used to have migraines back before she got pregnant.   Epworth Sleepiness Scale 0= would never doze 1= slight chance of dozing 2= moderate chance of dozing 3= high chance of dozing  Sitting and reading:1 Watching TV:1 Sitting inactive in a public place (ex. Theater or meeting):0 As a passenger in a car for an hour without a break:0 Lying down to rest in the afternoon:2 Sitting and talking to someone:0 Sitting quietly after lunch (no alcohol):1 In a car, while stopped in traffic:0 Total: 5     Objective:  Neurological Exam  Physical Exam Physical Examination:   Vitals:   04/30/17 0821  BP: (!) 141/94  Pulse: 88   General Examination: The patient is a very pleasant 42 y.o. female in no acute distress. She appears well-developed and well-nourished and well groomed.   HEENT: Normocephalic, atraumatic, pupils are equal, round and reactive to light and accommodation. Funduscopic exam is difficult, cannot exclude or confirm Papilledema, Visual fields are full by finger perimetry.  Extraocular tracking is good without limitation to gaze excursion or nystagmus noted. Normal smooth pursuit is noted. Hearing is grossly intact. Face is symmetric with normal facial animation and normal facial sensation. Speech is clear with no dysarthria noted. There is no hypophonia. There is no lip, neck/head, jaw or voice  tremor. Neck is supple with full range of passive and active motion. There are no carotid bruits on auscultation. Oropharynx exam reveals: moderate mouth dryness, good dental hygiene and marked airway crowding, due to larger tonsils and smaller airway, prominent uvula. Mallampati is class III. Tongue protrudes centrally and palate elevates symmetrically. Neck size is 14.5 inches.    Chest: Clear to auscultation without wheezing, rhonchi or crackles noted.  Heart: S1+S2+0, regular and normal without murmurs, rubs or gallops noted.   Abdomen: Soft, non-tender and non-distended with normal bowel sounds appreciated on auscultation.  Extremities: There is no pitting edema in the distal lower extremities bilaterally. Pedal pulses are intact.  Skin: Warm and dry without trophic changes noted.  Musculoskeletal: exam reveals no obvious joint deformities, tenderness or joint swelling or erythema.   Neurologically:  Mental status: The patient is awake, alert and oriented  in all 4 spheres. Her immediate and remote memory, attention, language skills and fund of knowledge are appropriate. There is no evidence of aphasia, agnosia, apraxia or anomia. Speech is clear with normal prosody and enunciation. Thought process is linear. Mood is normal and affect is normal.  Cranial nerves II - XII are as described above under HEENT exam. In addition: shoulder shrug is normal with equal shoulder height noted. Motor exam: Normal bulk, strength and tone is noted. There is no drift, tremor or rebound. Romberg is negative. Reflexes are 2+ throughout. Babinski: Toes are flexor bilaterally. Fine motor skills and coordination: intact with normal finger taps, normal hand movements, normal rapid alternating patting, normal foot taps and normal foot agility.  Cerebellar testing: No dysmetria or intention tremor on finger to nose testing. Heel to shin is unremarkable bilaterally. There is no truncal or gait ataxia.  Sensory exam:  intact to light touch, pinprick, vibration in the upper and lower extremities.  Gait, station and balance: She stands easily. No veering to one side is noted. No leaning to one side is noted. Posture is age-appropriate and stance is narrow based. Gait shows normal stride length and normal pace. No problems turning are noted. Tandem walk is unremarkable.  Assessment and Plan:  In summary, Mary Mcdaniel is a very pleasant 42 y.o.-year old female  with an underlying medical history of hypertension, allergies, anemia, who presents for neurologic consultation of bilateral papilledema, left side worse than the right, noted during a routine eye appointment which was about 2 years from her prior appointment which showed no abnormalities per patient report. Her history and physical exam are concerning for underlying pseudotumor cerebri. She also is quite obese, would benefit from weight loss and also sleep evaluation with a sleep study for concern for underlying obstructive sleep apnea. We will proceed with essential diagnostic testing which includes brain and orbital MRI with and without contrast, MRV to exclude a venous sinus thrombosis or stenosis and to exclude any structural cause for her fluid buildup. We will proceed with spinal fluid testing for opening pressure, closing pressure and routine CSF testing. We will proceed with a sleep study to rule out obstructive sleep apnea as she has a fairly crowded airway, is morbidly obese and has a history of snoring. She has gained weight and actually over the past more than 10 years. She is strongly advised to work on weight loss. She is also advised regarding the most serious complication of pseudotumor cerebri which is vision loss which can be permanent. She is encouraged to make regular follow-up appointments for formal eye testing including visual field testing at least on a 6 monthly basis with you for now. We will keep you posted as to her test results. We will  call with most of her results and I will see her back routinely in 3 months, sooner as needed. We will likely initiate medication treatment to keep her fluid pressure at bay, after her spinal fluid test results are available. Physical and neurological exam are nonfocal thankfully. She is reassured in that regard.  I explained the sleep test procedure, the MRI test and the spinal tap to her in detail today. I answered all her questions today and the patient was in agreement with above plan.  Thank you very much for allowing me to participate in the care of this nice patient. If I can be of any further assistance to you please do not hesitate to call me at [phone].  Sincerely,  Star Age, MD, PhD

## 2017-05-01 ENCOUNTER — Telehealth: Payer: Self-pay | Admitting: Neurology

## 2017-05-01 NOTE — Telephone Encounter (Signed)
Kim/GI [phone] called [facility] requires to know the specific name of viral culture that is being requested with LP. Can ask for Enrique SackKendra or Noreene LarssonJill if she is not available, Please call today.

## 2017-05-01 NOTE — Telephone Encounter (Signed)
I spoke with Dr. Frances FurbishAthar. She does not want a viral culture any longer, just a gram stain and bacterial culture tested from CSF with the LP. I called GI, spoke to HilliardJill, and advised her of this information, she verbalized understanding.

## 2017-05-05 ENCOUNTER — Ambulatory Visit
Admission: RE | Admit: 2017-05-05 | Discharge: 2017-05-05 | Disposition: A | Discharge status: Home / Self Care | Payer: BC Managed Care – PPO | Source: Ambulatory Visit | Attending: Neurology | Admitting: Neurology

## 2017-05-05 DIAGNOSIS — R0683 Snoring: Secondary | ICD-10-CM

## 2017-05-05 DIAGNOSIS — H4711 Papilledema associated with increased intracranial pressure: Secondary | ICD-10-CM

## 2017-05-05 DIAGNOSIS — R51 Headache: Secondary | ICD-10-CM

## 2017-05-05 DIAGNOSIS — Z6841 Body Mass Index (BMI) 40.0 and over, adult: Secondary | ICD-10-CM

## 2017-05-05 DIAGNOSIS — R519 Headache, unspecified: Secondary | ICD-10-CM

## 2017-05-05 MED ORDER — GADOBENATE DIMEGLUMINE 529 MG/ML IV SOLN
20.0000 mL | Freq: Once | INTRAVENOUS | Status: AC | PRN
  Administered 2017-05-05: 20 mL via INTRAVENOUS

## 2017-05-06 NOTE — Progress Notes (Signed)
Patient's brain MRI and orbital MRI as well as MRV are generally speaking normal. 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 There are no telltale findings of any stenosis of the venous drainage system of the blood meaning that there is no need for neurosurgical consultation or intravascular intervention. We will proceed with medical management as discussed during the office visit, overall good result on the brain scans. Huston FoleySaima Kerim Statzer, MD, PhD [facility])

## 2017-05-07 ENCOUNTER — Telehealth: Payer: Self-pay

## 2017-05-07 NOTE — Telephone Encounter (Signed)
I called pt and explained her MRI, MRV results. Pt reports that she has her LP scheduled for tomorrow. Pt asked if she should start taking the diamox? I advised her that Dr. Frances FurbishAthar has not prescribed it yet, most likely will review LP results before deciding on diamox, and we will discuss this when I call her for LP results. Pt verbalized understanding of results.

## 2017-05-07 NOTE — Telephone Encounter (Signed)
-----   Message from Huston FoleySaima Athar, MD sent at 05/06/2017  5:15 PM EST ----- Patient's brain MRI and orbital MRI as well as MRV are generally speaking normal. 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 There are no telltale findings of any stenosis of the venous drainage system of the blood meaning that there is no need for neurosurgical consultation or intravascular intervention. We will proceed with medical management as discussed during the office visit, overall good result on the brain scans. Huston FoleySaima Athar, MD, PhD [facility])

## 2017-05-08 ENCOUNTER — Ambulatory Visit
Admission: RE | Admit: 2017-05-08 | Discharge: 2017-05-08 | Disposition: A | Discharge status: Home / Self Care | Payer: BC Managed Care – PPO | Source: Ambulatory Visit | Attending: Neurology | Admitting: Neurology

## 2017-05-08 DIAGNOSIS — R0683 Snoring: Secondary | ICD-10-CM

## 2017-05-08 DIAGNOSIS — R51 Headache: Secondary | ICD-10-CM

## 2017-05-08 DIAGNOSIS — R519 Headache, unspecified: Secondary | ICD-10-CM

## 2017-05-08 DIAGNOSIS — H4711 Papilledema associated with increased intracranial pressure: Secondary | ICD-10-CM

## 2017-05-08 DIAGNOSIS — Z6841 Body Mass Index (BMI) 40.0 and over, adult: Secondary | ICD-10-CM

## 2017-05-08 LAB — CSF CELL COUNT WITH DIFFERENTIAL
RBC Count, CSF: 53 cells/uL — ABNORMAL HIGH (ref 0–10)
WBC, CSF: 0 cells/uL (ref 0–5)

## 2017-05-08 LAB — GLUCOSE, CSF: GLUCOSE CSF: 65 mg/dL (ref 40–80)

## 2017-05-08 LAB — PROTEIN, CSF: TOTAL PROTEIN, CSF: 37 mg/dL (ref 15–45)

## 2017-05-08 NOTE — Discharge Instructions (Signed)

## 2017-05-09 ENCOUNTER — Other Ambulatory Visit: Payer: Self-pay | Admitting: Neurology

## 2017-05-09 ENCOUNTER — Telehealth: Payer: Self-pay

## 2017-05-09 MED ORDER — ACETAZOLAMIDE 250 MG PO TABS: ORAL_TABLET | ORAL | 5 refills | Status: DC

## 2017-05-09 NOTE — Telephone Encounter (Signed)
I called pt and advised her of this information. I encouraged pt to continue with her sleep study, as scheduled in March. Pt will let us know if she has lingering s/e from taking the diamox. Pt verbalized understanding of results. Pt had no questions at this time but was encouraged to call back if questions arise.

## 2017-05-09 NOTE — Telephone Encounter (Signed)
I called pt to discuss her LP results. No answer, left a message asking her to call me back.

## 2017-05-09 NOTE — Progress Notes (Signed)
Pls call Pt: spinal fluid looks normal, which is good, but LP showed indeed elevated spinal fluid pressure. Therefore, as discussed, I would like for her to start a medication to help keep the pressure in the normal range (which it should be now, after the LP, but it likely won't stay in the normal range, if we don't help so to speak). Diamox (generic name: acetazolamide) 250 mg strength: Take one pill each night at bedtime for 1 week, then 1 pill twice daily for 1 week, then 1 in AM and 2 at night x 1 week, then 2 bid thereafter. Common side effects reported are: Dizziness, lightheadedness, increase in urine output, blurry vision, dry mouth, drowsiness, loss of appetite, upset stomach, headache and tiredness, tingling in the hands and feet and change in taste, especially with carbonated sodas. Most side effects are transient especially during the first few days as the body adjusts to the medication.    Rx sent to pharm. FU as scheduled (should be). Janene Harveysa

## 2017-05-09 NOTE — Telephone Encounter (Signed)
-----   Message from Huston FoleySaima Athar, MD sent at 05/09/2017  8:48 AM EST ----- Pls call Pt: spinal fluid looks normal, which is good, but LP showed indeed elevated spinal fluid pressure. Therefore, as discussed, I would like for her to start a medication to help keep the pressure in the normal range (which it should be now, after the LP, but it likely won't stay in the normal range, if we don't help so to speak). Diamox (generic name: acetazolamide) 250 mg strength: Take one pill each night at bedtime for 1 week, then 1 pill twice daily for 1 week, then 1 in AM and 2 at night x 1 week, then 2 bid thereafter. Common side effects reported are: Dizziness, lightheadedness, increase in urine output, blurry vision, dry mouth, drowsiness, loss of appetite, upset stomach, headache and tiredness, tingling in the hands and feet and change in taste, especially with carbonated sodas. Most side effects are transient especially during the first few days as the body adjusts to the medication.    Rx sent to pharm. FU as scheduled (should be). Janene Harveysa

## 2017-05-12 LAB — CSF CULTURE W GRAM STAIN
MICRO NUMBER:: 90223885
Result:: NO GROWTH
SPECIMEN QUALITY:: ADEQUATE

## 2017-05-12 LAB — CSF CULTURE

## 2017-06-02 ENCOUNTER — Ambulatory Visit (INDEPENDENT_AMBULATORY_CARE_PROVIDER_SITE_OTHER): Payer: BC Managed Care – PPO | Admitting: Neurology

## 2017-06-02 DIAGNOSIS — H4711 Papilledema associated with increased intracranial pressure: Secondary | ICD-10-CM

## 2017-06-02 DIAGNOSIS — G4733 Obstructive sleep apnea (adult) (pediatric): Secondary | ICD-10-CM

## 2017-06-02 DIAGNOSIS — R51 Headache: Secondary | ICD-10-CM

## 2017-06-02 DIAGNOSIS — Z6841 Body Mass Index (BMI) 40.0 and over, adult: Secondary | ICD-10-CM

## 2017-06-02 DIAGNOSIS — R0683 Snoring: Secondary | ICD-10-CM

## 2017-06-02 DIAGNOSIS — G4720 Circadian rhythm sleep disorder, unspecified type: Secondary | ICD-10-CM

## 2017-06-02 DIAGNOSIS — R519 Headache, unspecified: Secondary | ICD-10-CM

## 2017-06-05 LAB — FUNGUS CULTURE W SMEAR
MICRO NUMBER:: 90223887
SMEAR: NONE SEEN
SPECIMEN QUALITY:: ADEQUATE

## 2017-06-07 NOTE — Addendum Note (Signed)
Addended by: Huston FoleyATHAR, Deaun Rocha on: 06/07/2017 09:45 AM   Modules accepted: Orders

## 2017-06-07 NOTE — Progress Notes (Signed)
Patient referred by her eye doctor, seen by me on 04/30/17, diagnostic PSG on 06/02/17.   Please call and notify the patient that the recent sleep study showed moderate to severe obstructive sleep apnea. I recommend treatment for this in the form of CPAP. This will require a repeat sleep study for proper titration and mask fitting and correct monitoring of the oxygen saturations. Please explain to patient. I have placed an order in the chart. Thanks.  Huston FoleySaima Zaire Levesque, MD, PhD [facility])

## 2017-06-07 NOTE — Procedures (Signed)
PATIENT'S NAME:  Mary Mcdaniel, Mary Mcdaniel DOB:      1975/09/14      MR#:    [id number]     DATE OF RECORDING: 06/02/2017 REFERRING M.D.: Fredrich Birks, OD,          PCP: Porfirio Oar, PA-C Study Performed:   Baseline Polysomnogram HISTORY: 42 year old woman with a history of hypertension, allergies, anemia, who was noted to have bilateral papilledema, history of migraines, and obesity, who reports snoring and morning headaches. The patient endorsed the Epworth Sleepiness Scale at 5/24 points. The patient's weight 255 pounds with a height of 64 (inches), resulting in a BMI of 44.8 kg/m2. The patient's neck circumference measured 14.5 inches.  CURRENT MEDICATIONS: Fluticasone, Levonorgestrel and Loratadine.   PROCEDURE:  This is a multichannel digital polysomnogram utilizing the Somnostar 11.2 system.  Electrodes and sensors were applied and monitored per AASM Specifications.   EEG, EOG, Chin and Limb EMG, were sampled at 200 Hz.  ECG, Snore and Nasal Pressure, Thermal Airflow, Respiratory Effort, CPAP Flow and Pressure, Oximetry was sampled at 50 Hz. Digital video and audio were recorded.      BASELINE STUDY  Lights Out was at 21:08 and Lights On at 05:00.  Total recording time (TRT) was 472 minutes, with a total sleep time (TST) of  437.5 minutes.   The patient's sleep latency was 21 minutes. REM latency was 136.5 minutes, which is mildly delayed. The sleep efficiency was 92.7 %.     SLEEP ARCHITECTURE: WASO (Wake after sleep onset) was 13.5 minutes. There were 16.5 minutes in Stage N1, 349 minutes Stage N2, 31 minutes Stage N3 and 41 minutes in Stage REM. The percentage of Stage N1 was 3.8%, Stage N2 was 79.8%, which is increased, Stage N3 was 7.1%, and Stage R (REM sleep) was 9.4% which is decreased. The arousals were noted as: 49 were spontaneous, 2 were associated with PLMs, 123 were associated with respiratory events.  Audio and video analysis did not show any abnormal or unusual movements, behaviors,  phonations or vocalizations. The patient took no bathroom breaks. Moderate snoring was noted. The EKG was in keeping with normal sinus rhythm (NSR).  RESPIRATORY ANALYSIS:  There were a total of 124 respiratory events:  21 obstructive apneas, 0 central apneas and 0 mixed apneas with a total of 21 apneas and an apnea index (AI) of 2.9 /hour. There were 103 hypopneas with a hypopnea index of 14.1 /hour. The patient also had 0 respiratory event related arousals (RERAs).      The total APNEA/HYPOPNEA INDEX (AHI) was 17/hour and the total RESPIRATORY DISTURBANCE INDEX was 17. /hour.  30 events occurred in REM sleep and 184 events in NREM. The REM AHI was 43.9 /hour, versus a non-REM AHI of 14.2. The patient spent 198.5 minutes of total sleep time in the supine position and 239 minutes in non-supine.. The supine AHI was 25.4 versus a non-supine AHI of 10.0.  OXYGEN SATURATION & C02:  The Wake baseline 02 saturation was 97%, with the lowest being 68%. Time spent below 89% saturation equaled 17 minutes.  PERIODIC LIMB MOVEMENTS: The patient had a total of 12 Periodic Limb Movements.  The Periodic Limb Movement (PLM) index was 1.6 and the PLM Arousal index was .3/hour.  Post-study, the patient indicated that sleep was the same as usual.   IMPRESSION:  1. Obstructive Sleep Apnea (OSA) 2. Dysfunctions associated with sleep stages or arousal from sleep  RECOMMENDATIONS:  1. This study demonstrates moderate to severe obstructive sleep  apnea, with a total AHI of 17/hour, REM AHI of 43.9/hour, supine AHI of 25.4/hour and O2 nadir of 68%. Treatment with positive airway pressure in the form of CPAP is recommended. This will require a full night titration study to optimize therapy. Other treatment options may include avoidance of supine sleep position along with weight loss, upper airway or jaw surgery in selected patients or the use of an oral appliance in certain patients. ENT evaluation and/or consultation with  a maxillofacial surgeon or dentist may be feasible in some instances.    2. Please note that untreated obstructive sleep apnea carries additional perioperative morbidity. Patients with significant obstructive sleep apnea should receive perioperative PAP therapy and the surgeons and particularly the anesthesiologist should be informed of the diagnosis and the severity of the sleep disordered breathing.  3. This study shows sleep fragmentation and abnormal sleep stage percentages; these are nonspecific findings and per se do not signify an intrinsic sleep disorder or a cause for the patient's sleep-related symptoms. Causes include (but are not limited to) the first night effect of the sleep study, circadian rhythm disturbances, medication effect or an underlying mood disorder or medical problem.  4. The patient should be cautioned not to drive, work at heights, or operate dangerous or heavy equipment when tired or sleepy. Review and reiteration of good sleep hygiene measures should be pursued with any patient. 5. The patient will be seen in [facility] for discussion of the test results and further management strategies. The referring provider will be notified of the test results.  I certify that I have reviewed the entire raw data recording prior to the issuance of this report in accordance with the Standards of Accreditation of the American Academy of Sleep Medicine (AASM)   Huston FoleySaima Nahshon Reich, MD, PhD Diplomat, American Board of Psychiatry and Neurology (Neurology and Sleep Medicine)

## 2017-06-10 ENCOUNTER — Telehealth: Payer: Self-pay

## 2017-06-10 NOTE — Telephone Encounter (Signed)
Noted, thanks!

## 2017-06-10 NOTE — Telephone Encounter (Signed)
Called to schedule pt for CPAP study.  Informed pt of results on baseline sleep study on 06/02/17.  Pt understood and had no questions.  Pt scheduled CPAP study on 06/24/17 at 8pm.

## 2017-06-20 ENCOUNTER — Encounter: Payer: Self-pay | Admitting: Physician Assistant

## 2017-07-03 ENCOUNTER — Ambulatory Visit (INDEPENDENT_AMBULATORY_CARE_PROVIDER_SITE_OTHER): Payer: BC Managed Care – PPO | Admitting: Neurology

## 2017-07-03 DIAGNOSIS — G4733 Obstructive sleep apnea (adult) (pediatric): Secondary | ICD-10-CM

## 2017-07-03 DIAGNOSIS — Z6841 Body Mass Index (BMI) 40.0 and over, adult: Secondary | ICD-10-CM

## 2017-07-03 DIAGNOSIS — H4711 Papilledema associated with increased intracranial pressure: Secondary | ICD-10-CM

## 2017-07-03 DIAGNOSIS — G4720 Circadian rhythm sleep disorder, unspecified type: Secondary | ICD-10-CM

## 2017-07-09 ENCOUNTER — Telehealth: Payer: Self-pay

## 2017-07-09 NOTE — Telephone Encounter (Signed)
-----   Message from Huston FoleySaima Athar, MD sent at 07/09/2017  1:58 PM EDT ----- Patient referred by her eye doctor, seen by me on 04/30/17, diagnostic PSG on 06/02/17. Patient had a CPAP titration study on 07/03/17.  Please call and inform patient that I have entered an order for treatment with positive airway pressure (PAP) treatment for obstructive sleep apnea (OSA). She did well during the latest sleep study with CPAP. We will, therefore, arrange for a machine for home use through a DME (durable medical equipment) company of Her choice; and I will see the patient back in follow-up in about 10 weeks. Please also explain to the patient that I will be looking out for compliance data, which can be downloaded from the machine (stored on an SD card, that is inserted in the machine) or via remote access through a modem, that is built into the machine. At the time of the followup appointment we will discuss sleep study results and how it is going with PAP treatment at home. Please advise patient to bring Her machine at the time of the first FU visit, even though this is cumbersome. Bringing the machine for every visit after that will likely not be needed, but often helps for the first visit to troubleshoot if needed. Please re-enforce the importance of compliance with treatment and the need for us to monitor compliance data - often an insurance requirement and actually good feedback for the patient as far as how they are doing.  Also remind patient, that any interim PAP machine or mask issues should be first addressed with the DME company, as they can often help better with technical and mask fit issues. Please ask if patient has a preference regarding DME company.  Please also make sure, the patient has a follow-up appointment with me in about 10 weeks from the setup date, thanks. May see one of our nurse practitioners if needed for proper timing of the FU appointment.  Please fax or rout report to the referring provider.  Thanks,   Huston FoleySaima Athar, MD, PhD [facility]

## 2017-07-09 NOTE — Procedures (Signed)
PATIENT'S NAME:  Mary Mcdaniel, Mary Mcdaniel DOB:      Nov 28, 1975      MR#:    [id number]     DATE OF RECORDING: 07/03/2017 REFERRING M.D.:  Porfirio OarJeffery Chelle, PA-C Study Performed:   CPAP  Titration HISTORY: 42 year old woman with a history of hypertension, allergies, anemia, who was noted to have bilateral papilledema, history of migraines, and morbid obesity, who returns for a CPAP titration study. Her baseline PSG on 06/02/17 showed moderate to severe obstructive sleep apnea, with a total AHI of 17/hour, REM AHI of 43.9/hour, supine AHI of 25.4/hour and O2 nadir of 68%. The patient endorsed the Epworth Sleepiness Scale at 5/24 points. The patient's weight 256 pounds with a height of 63 (inches), resulting in a BMI of 45.3 kg/m2. The patient's neck circumference measured 14 inches.  CURRENT MEDICATIONS: Fluticasone, Levonorgestrel and Loratadine.  PROCEDURE:  This is a multichannel digital polysomnogram utilizing the SomnoStar 11.2 system.  Electrodes and sensors were applied and monitored per AASM Specifications.   EEG, EOG, Chin and Limb EMG, were sampled at 200 Hz.  ECG, Snore and Nasal Pressure, Thermal Airflow, Respiratory Effort, CPAP Flow and Pressure, Oximetry was sampled at 50 Hz. Digital video and audio were recorded.      The patient was fitted with a small Dreamwear FFM (patient was unable to tolerate nasal mask). CPAP was initiated at 5.0 cmH20 with heated humidity per AASM standards and pressure was advanced to 15 cmH20 because of hypopneas, apneas and desaturations.  At a PAP pressure of 14 cmH20, there was a reduction of the AHI to 2.3/hour with supine REM sleep achieved and O2 nadir of 94%.   Lights Out was at 23:12 and Lights On at 05:12. Total recording time (TRT) was 360 minutes, with a total sleep time (TST) of 283 minutes. The patient's sleep latency was 3.5 minutes. REM latency was 95.5 minutes, which is normal. The sleep efficiency was 78.6 %.    SLEEP ARCHITECTURE: WASO (Wake after sleep  onset) was 43.5 minutes with overall mild sleep fragmentation noted, and 2 longer periods of wakefulness.  There were 9.5 minutes in Stage N1, 134.5 minutes Stage N2, 76.5 minutes Stage N3 and 62.5 minutes in Stage REM.  The percentage of Stage N1 was 3.4%, Stage N2 was 47.5%, Stage N3 was 27%, which is increased, and Stage R (REM sleep) was 22.1%, which is normal. The arousals were noted as: 83 were spontaneous, 11 were associated with PLMs, 21 were associated with respiratory events.  RESPIRATORY ANALYSIS:  There was a total of 42 respiratory events: 5 obstructive apneas, 0 central apneas and 0 mixed apneas with a total of 5 apneas and an apnea index (AI) of 1.1 /hour. There were 37 hypopneas with a hypopnea index of 7.8/hour. The patient also had 0 respiratory event related arousals (RERAs).      The total APNEA/HYPOPNEA INDEX  (AHI) was 8.9/hour and the total RESPIRATORY DISTURBANCE INDEX was 8.9 0./hour  9 events occurred in REM sleep and 33 events in NREM. The REM AHI was 8.6 /hour versus a non-REM AHI of 9. 0./hour.  The patient spent 283 minutes of total sleep time in the supine position and 0 minutes in non-supine. The supine AHI was 8.9, versus a non-supine AHI of 0.0.  OXYGEN SATURATION & C02:  The baseline 02 saturation was 96%, with the lowest being 89%. Time spent below 89% saturation equaled 0 minutes.  PERIODIC LIMB MOVEMENTS:  The patient had a total of 35 Periodic Limb  Movements. The Periodic Limb Movement (PLM) index was 7.4 and the PLM Arousal index was 2.3 /hour.  Audio and video analysis did not show any abnormal or unusual movements, behaviors, phonations or vocalizations. The patient took no bathroom breaks. The EKG was in keeping with normal sinus rhythm (NSR).  Post-study, the patient indicated that sleep was better than usual.   IMPRESSION:   1. Obstructive Sleep Apnea (OSA) 2. Dysfunctions associated with sleep stages or arousal from sleep   RECOMMENDATIONS:   1. This  study demonstrates near-complete resolution of the patient's obstructive sleep apnea with CPAP therapy. I will, therefore, start the patient on home CPAP treatment at a pressure of 14 cm via small FFM with heated humidity. The patient should be reminded to be fully compliant with PAP therapy to improve sleep related symptoms and decrease long term cardiovascular risks. The patient should be reminded, that it may take up to 3 months to get fully used to using PAP with all planned sleep. The earlier full compliance is achieved, the better long term compliance tends to be. Please note that untreated obstructive sleep apnea carries additional perioperative morbidity. Patients with significant obstructive sleep apnea should receive perioperative PAP therapy and the surgeons and particularly the anesthesiologist should be informed of the diagnosis and the severity of the sleep disordered breathing. 2. This study shows sleep fragmentation and abnormal sleep stage percentages; these are nonspecific findings and per se do not signify an intrinsic sleep disorder or a cause for the patient's sleep-related symptoms. Causes include (but are not limited to) the first night effect of the sleep study, circadian rhythm disturbances, medication effect or an underlying mood disorder or medical problem.  3. The patient should be cautioned not to drive, work at heights, or operate dangerous or heavy equipment when tired or sleepy. Review and reiteration of good sleep hygiene measures should be pursued with any patient. 4. The patient will be seen in follow-up by Dr. Frances Furbish at [facility] for discussion of the test results and further management strategies. The referring provider will be notified of the test results.   I certify that I have reviewed the entire raw data recording prior to the issuance of this report in accordance with the Standards of Accreditation of the American Academy of Sleep Medicine (AASM)     [name], MD,  PhD Diplomat, American Board of Psychiatry and Neurology (Neurology and Sleep Medicine)

## 2017-07-09 NOTE — Telephone Encounter (Signed)
I called pt to discuss her sleep study results. No answer, left a message asking her to call me back. 

## 2017-07-09 NOTE — Progress Notes (Signed)
Patient referred by her eye doctor, seen by me on 04/30/17, diagnostic PSG on 06/02/17. Patient had a CPAP titration study on 07/03/17.  Please call and inform patient that I have entered an order for treatment with positive airway pressure (PAP) treatment for obstructive sleep apnea (OSA). She did well during the latest sleep study with CPAP. We will, therefore, arrange for a machine for home use through a DME (durable medical equipment) company of Her choice; and I will see the patient back in follow-up in about 10 weeks. Please also explain to the patient that I will be looking out for compliance data, which can be downloaded from the machine (stored on an SD card, that is inserted in the machine) or via remote access through a modem, that is built into the machine. At the time of the followup appointment we will discuss sleep study results and how it is going with PAP treatment at home. Please advise patient to bring Her machine at the time of the first FU visit, even though this is cumbersome. Bringing the machine for every visit after that will likely not be needed, but often helps for the first visit to troubleshoot if needed. Please re-enforce the importance of compliance with treatment and the need for us to monitor compliance data - often an insurance requirement and actually good feedback for the patient as far as how they are doing.  Also remind patient, that any interim PAP machine or mask issues should be first addressed with the DME company, as they can often help better with technical and mask fit issues. Please ask if patient has a preference regarding DME company.  Please also make sure, the patient has a follow-up appointment with me in about 10 weeks from the setup date, thanks. May see one of our nurse practitioners if needed for proper timing of the FU appointment.  Please fax or rout report to the referring provider. Thanks,   Huston FoleySaima Harace Mccluney, MD, PhD [facility]

## 2017-07-09 NOTE — Telephone Encounter (Signed)
I called pt. I advised pt that Dr. Frances FurbishAthar reviewed their sleep study results and found that pt did well during the latest sleep study with the cpap. Dr. Frances FurbishAthar recommends that pt start a cpap at home. I reviewed PAP compliance expectations with the pt. Pt is agreeable to starting a CPAP. I advised pt that an order will be sent to a DME, [facility], and [employer] will call the pt within about one week after they file with the pt's insurance. [facility] will show the pt how to use the machine, fit for masks, and troubleshoot the CPAP if needed. A follow up appt was made for insurance purposes with Dr. Frances FurbishAthar on 09/16/17. Pt verbalized understanding to arrive 15 minutes early and bring their CPAP. Pt asked to cancel the May 2019 appt with Dr. 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 A letter with all of this information in it will be mailed to the pt as a reminder. I verified with the pt that the address we have on file is correct. Pt verbalized understanding of results. Pt had no questions at this time but was encouraged to call back if questions arise.

## 2017-07-09 NOTE — Telephone Encounter (Signed)
Patient is returning your call.  

## 2017-07-09 NOTE — Addendum Note (Signed)
Addended by: Huston FoleyATHAR, Lysette Lindenbaum on: 07/09/2017 01:58 PM   Modules accepted: Orders

## 2017-07-25 ENCOUNTER — Encounter (HOSPITAL_COMMUNITY): Payer: Self-pay | Admitting: Emergency Medicine

## 2017-07-25 ENCOUNTER — Ambulatory Visit (HOSPITAL_COMMUNITY)
Admission: EM | Admit: 2017-07-25 | Discharge: 2017-07-25 | Disposition: A | Discharge status: Home / Self Care | Payer: BC Managed Care – PPO | Attending: Family Medicine | Admitting: Family Medicine

## 2017-07-25 DIAGNOSIS — H6983 Other specified disorders of Eustachian tube, bilateral: Secondary | ICD-10-CM

## 2017-07-25 MED ORDER — IPRATROPIUM BROMIDE 0.03 % NA SOLN: 2.0000 | Freq: Two times a day (BID) | NASAL | 0 refills | Status: DC

## 2017-07-25 NOTE — ED Triage Notes (Signed)
Pt c/o bilateral ear pain since Tuesday. L ear is worse.

## 2017-07-25 NOTE — Telephone Encounter (Signed)
Received this notice from [employer]: "We spoke with her 07/17/2017 to update that we were verifying everything with both of her [employer] Plans, I made contact with her to schedule just now and she is scheduled for Monday 07/29/2017 at 10am."

## 2017-07-25 NOTE — ED Provider Notes (Signed)
[facility]   [id number] 07/25/17 Arrival Time: 1901   SUBJECTIVE:  Mary Mcdaniel is a 42 y.o. female who presents to the urgent care with complaint of bilateral ear pain since Tuesday. L ear is worse.  Patient uses Nasonex for chronic allergies but over the last day or 2 start to feel pressure in her ears.  Patient is also taking Diamox for cerebral hypertension for the last month.  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   Past Medical History:  Diagnosis Date  . Allergy   . Anemia   . Environmental allergies   . Hypertension    Family History  Problem Relation Age of Onset  . Cancer Mother        breast  . Hypertension Mother   . Macular degeneration Mother   . Hypertension Father   . Hyperlipidemia Father   . Diabetes Father   . Cancer Maternal Grandfather        lung  . Diabetes Paternal Grandmother   . Hypertension Maternal Grandmother   . Stroke Maternal Grandmother   . Autism Son    Social History   Socioeconomic History  . Marital status: Married    Spouse name: Kei Langhorst  . Number of children: 3  . Years of education: Master's  . Highest education level: Not on file  Occupational History  . 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 Financial resource strain: Not on file  . Food insecurity:    Worry: Not on file    Inability: Not on file  . Transportation needs:    Medical: Not on file    Non-medical: Not on file  Tobacco Use  . Smoking status: Never Smoker  . Smokeless tobacco: Never Used  Substance and Sexual Activity  . Alcohol use: No    Alcohol/week: 0.0 oz  . Drug use: No  . Sexual activity: Yes    Partners: Male    Birth control/protection: IUD    Comment: Mirena 08/2012  Lifestyle  . Physical activity:    Days per week: Not on file    Minutes per session: Not on file  . Stress: Not on file  Relationships  . Social connections:    Talks on phone: Not on  file    Gets together: Not on file    Attends religious service: Not on file    Active member of club or organization: Not on file    Attends meetings of clubs or organizations: Not on file    Relationship status: Not on file  . Intimate partner violence:    Fear of current or ex partner: Not on file    Emotionally abused: Not on file    Physically abused: Not on file    Forced sexual activity: Not on file  Other Topics Concern  . Not on file  Social History Narrative   Married to husband Alycia Rossetti, two biological kids, daughter Kyung Rudd (2008) and son Alycia Rossetti (2014), and an adopted daughter Jon Gills. Alycia Rossetti also has a daughter, Penni Bombard, from a previous relationship.    No outpatient medications have been marked as taking for the 07/25/17 encounter [facility] Encounter).   No Known Allergies    ROS: As per HPI, remainder of ROS negative.   OBJECTIVE:   Vitals:   07/25/17 1941  BP: 131/84  Pulse: 85  Resp: 18  Temp: 98.3 F (36.8 C)  SpO2: 100%  General appearance: alert; no distress Eyes: PERRL; EOMI; conjunctiva normal HENT: normocephalic; atraumatic; TMs slight retraction on the left, external ears normal without trauma; nasal mucosa normal; oral mucosa normal Neck: supple; no adenopathy Back: no CVA tenderness Extremities: no cyanosis or edema; symmetrical with no gross deformities Skin: warm and dry Neurologic: normal gait; grossly normal Psychological: alert and cooperative; normal mood and affect      Labs:  Results for orders placed or performed during the hospital encounter of 05/08/17  CSF culture  Result Value Ref Range   MICRO NUMBER: [id number]    SPECIMEN QUALITY: ADEQUATE    Source CEREBROSPINAL FLUID (CSF)    STATUS: FINAL    GRAM STAIN: No white blood cells seen No organisms seen    Result: No growth after 4 days   Fungus culture w smear  Result Value Ref Range   MICRO NUMBER: [id number]    SPECIMEN QUALITY: ADEQUATE    Source: CEREBROSPINAL FLUID  (CSF)    STATUS: FINAL    SMEAR: No fungal elements seen.    CULTURE: No fungus isolated   CSF cell count with differential  Result Value Ref Range   Color, CSF COLORLESS COLORLESS   Appearance, CSF CLEAR CLEAR   RBC Count, CSF 53 (H) 0 - 10 cells/uL   WBC, CSF 0 0 - 5 cells/uL   Segmented Neutrophils-CSF CANCELED    Comment:    Glucose, CSF  Result Value Ref Range   Glucose, CSF 65 40 - 80 mg/dL  Protein, CSF  Result Value Ref Range   Total Protein, CSF 37 15 - 45 mg/dL    Labs Reviewed - No data to display  No results found.     ASSESSMENT & PLAN:  1. Dysfunction of both eustachian tubes     Meds ordered this encounter  Medications  . ipratropium (ATROVENT) 0.03 % nasal spray    Sig: Place 2 sprays into both nostrils 2 (two) times daily.    Dispense:  30 mL    Refill:  0    Reviewed expectations re: course of current medical issues. Questions answered. Outlined signs and symptoms indicating need for more acute intervention. Patient verbalized understanding. After Visit Summary given.    Procedures:      Elvina Sidle, MD 07/25/17 2008

## 2017-07-25 NOTE — Telephone Encounter (Signed)
I have reached out to [facility] to find out what is going on. 

## 2017-07-25 NOTE — Telephone Encounter (Signed)
Received this notice from [facility]: "I will Call her within the next 30 minutes.  The phones have been ringing off the hook and I did see that last name on the Caller ID, I have not yet checked the Voicemails and am not in her chart right now.  I will update you as soon as I reach out to her."

## 2017-07-25 NOTE — Telephone Encounter (Signed)
Pt called stating that [facility] hasn't reached out to her about her CPAP, also stating she has tried calling them but no answer. Pt requesting a call to see if there is somewhere else she can go or if this is a normal process.

## 2017-07-29 ENCOUNTER — Ambulatory Visit: Payer: BC Managed Care – PPO | Admitting: Neurology

## 2017-09-16 ENCOUNTER — Ambulatory Visit: Payer: Self-pay | Admitting: Neurology

## 2017-09-22 ENCOUNTER — Other Ambulatory Visit: Payer: Self-pay | Admitting: Neurology

## 2017-09-23 NOTE — Telephone Encounter (Signed)
Pt returned my call. She confirmed that she is taking diamox 250mg  2 tablets BID. Will refill to [facility] per pt request.

## 2017-09-23 NOTE — Telephone Encounter (Signed)
I called pt to discuss her diamox. Before refilling, I want to verify that she is taking 2 tablets BID.  No answer, left a message asking her to call me back.

## 2017-11-06 ENCOUNTER — Other Ambulatory Visit: Payer: Self-pay | Admitting: Obstetrics and Gynecology

## 2017-11-06 DIAGNOSIS — Z803 Family history of malignant neoplasm of breast: Secondary | ICD-10-CM

## 2017-11-07 ENCOUNTER — Ambulatory Visit (INDEPENDENT_AMBULATORY_CARE_PROVIDER_SITE_OTHER): Payer: BC Managed Care – PPO | Admitting: Adult Health

## 2017-11-07 ENCOUNTER — Ambulatory Visit: Payer: Self-pay | Admitting: Neurology

## 2017-11-07 ENCOUNTER — Encounter: Payer: Self-pay | Admitting: Adult Health

## 2017-11-07 VITALS — BP 116/72 | HR 87 | Ht 63.75 in | Wt 254.6 lb

## 2017-11-07 DIAGNOSIS — Z9989 Dependence on other enabling machines and devices: Secondary | ICD-10-CM | POA: Diagnosis not present

## 2017-11-07 DIAGNOSIS — G932 Benign intracranial hypertension: Secondary | ICD-10-CM

## 2017-11-07 DIAGNOSIS — G4733 Obstructive sleep apnea (adult) (pediatric): Secondary | ICD-10-CM

## 2017-11-07 NOTE — Patient Instructions (Signed)
Your Plan:  Continue using CPAP nightly and >4 hours each night Continue Diamox If your symptoms worsen or you develop new symptoms please let us know. \  Thank you [facility]. I hope we have been able to provide you high quality care today.  You may receive a patient satisfaction survey over the next few weeks. We would appreciate your feedback and comments so that we may continue to improve ourselves and the health of our patients.

## 2017-11-07 NOTE — Progress Notes (Addendum)
PATIENT: Mary Mcdaniel DOB: 1975-05-13  REASON FOR VISIT: follow up HISTORY FROM: patient  HISTORY OF PRESENT ILLNESS: Today 11/07/17:  Mary Mcdaniel is a 42 year old female with a history of obstructive sleep apnea and pseudotumor cerebri.  She returns today for follow-up.  This is her first CPAP download.  Her download indicates that she use her machine 28 out of 30 days for compliance of 93%.  She use her machine greater than 4 hours 18 out of 30 days for compliance of 33%.  On average she uses her machine 3 hours and 37 minutes.  Her residual AHI is 0.9 on 14 cmH2O with EPR of 3.  Her leak in the 95th percentile is 24 L/min.  The patient states that she does not sleep but  4 to 5 hours each night.  She states that there has been occasions where she went to the bathroom but did not put the machine back on.  She continues on Diamox.  Denies any headaches.  She recently went to the eye doctor and the report shows that the edema has improved.  She returns today for evaluation.  HISTORY 04/30/17: Mary Mcdaniel is a 42 year old right-handed woman with an underlying medical history of hypertension, allergies, anemia, who was noted to have bilateral papilledema (per your report L>R) during her routine eye exam on 04/24/2017, which was nearly 2 years after her previous exam. She had no particular Sx at the time. She has a Hx of migraines, but these improved after her pregnancies; she has a ten yo and 41 yo. She used to use imitrex, but now uses Advil prn. She has consistently gained weight. 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 I reviewed your office note from 04/24/2017 which you kindly included. Thankfully, she had no significant visual deficits or visual acuity deficit, no change in her glasses Rx for myopia. She does report of recurrent headaches and BMI is in the morbidly obese range of about 40. Her Epworth sleepiness score is 5 out of 24, fatigue score is  a female 5063. She lives at home with her husband and 2 children. She is a nonsmoker and does not utilize alcohol, drinks caffeine in the form of soda, one to 2 small bottles per day on average. She does not have night to night nocturia but has woken up with a headache. She admits that she does not always get 7-8 hours of sleep, goes to bed late, sometimes after midnight and rise time is around 6. She occasionally takes a nap. Snoring is reportedly loud at times. In the past several years she has perpetually gained weight. She has not worked hard on weight loss she admits.  REVIEW OF SYSTEMS: Out of a complete 14 system review of symptoms, the patient complains only of the following symptoms, and all other reviewed systems are negative.  See HPI  ALLERGIES: No Known Allergies  HOME MEDICATIONS: Outpatient Medications Prior to Visit  Medication Sig Dispense Refill  . acetaZOLAMIDE (DIAMOX) 250 MG tablet Take 2 tablets (500 mg total) by mouth 2 (two) times daily. 120 tablet 2  . fluticasone (FLONASE) 50 MCG/ACT nasal spray Place 2 sprays into both nostrils daily. 48 g 3  . ipratropium (ATROVENT) 0.03 % nasal spray Place 2 sprays into both nostrils 2 (two) times daily. 30 mL 0  . levonorgestrel (MIRENA) 20 MCG/24HR IUD 1 each by Intrauterine route once.    . loratadine (CLARITIN) 5 MG/5ML syrup Take by  mouth daily.     No facility-administered medications prior to visit.     PAST MEDICAL HISTORY: Past Medical History:  Diagnosis Date  . Allergy   . Anemia   . Environmental allergies   . Hypertension     PAST SURGICAL HISTORY: Past Surgical History:  Procedure Laterality Date  . BREAST SURGERY     reduction    FAMILY HISTORY: Family History  Problem Relation Age of Onset  . Cancer Mother        breast  . Hypertension Mother   . Macular degeneration Mother   . Hypertension Father   . Hyperlipidemia Father   . Diabetes Father   . Cancer Maternal Grandfather        lung  .  Diabetes Paternal Grandmother   . Hypertension Maternal Grandmother   . Stroke Maternal Grandmother   . Autism Son     SOCIAL HISTORY: Social History   Socioeconomic History  . Marital status: Married    Spouse name: Marsia Cino  . Number of children: 3  . Years of education: Master's  . Highest education level: Not on file  Occupational History  . 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 Financial resource strain: Not on file  . Food insecurity:    Worry: Not on file    Inability: Not on file  . Transportation needs:    Medical: Not on file    Non-medical: Not on file  Tobacco Use  . Smoking status: Never Smoker  . Smokeless tobacco: Never Used  Substance and Sexual Activity  . Alcohol use: No    Alcohol/week: 0.0 standard drinks  . Drug use: No  . Sexual activity: Yes    Partners: Male    Birth control/protection: IUD    Comment: Mirena 08/2012  Lifestyle  . Physical activity:    Days per week: Not on file    Minutes per session: Not on file  . Stress: Not on file  Relationships  . Social connections:    Talks on phone: Not on file    Gets together: Not on file    Attends religious service: Not on file    Active member of club or organization: Not on file    Attends meetings of clubs or organizations: Not on file    Relationship status: Not on file  . Intimate partner violence:    Fear of current or ex partner: Not on file    Emotionally abused: Not on file    Physically abused: Not on file    Forced sexual activity: Not on file  Other Topics Concern  . Not on file  Social History Narrative   Married to husband Mary Mcdaniel, two biological kids, daughter Mary Mcdaniel (2008) and son Mary Mcdaniel (2014), and an adopted daughter Mary Mcdaniel. Mary Mcdaniel also has a daughter, Mary Mcdaniel, from a previous relationship.       PHYSICAL EXAM  Vitals:   11/07/17 1026  BP: 116/72  Pulse: 87  Weight: 254 lb 9.6 oz (115.5 kg)  Height: 5' 3.75"  (1.619 m)   Body mass index is 44.05 kg/m.  Generalized: Well developed, in no acute distress   Neurological examination  Mentation: Alert oriented to time, place, history taking. Follows all commands speech and language fluent Cranial nerve II-XII: Pupils were equal round reactive to light. Extraocular movements were full, visual field were full on confrontational test. Facial sensation and strength were normal. Uvula  tongue midline. Head turning and shoulder shrug  were normal and symmetric. Motor: The motor testing reveals 5 over 5 strength of all 4 extremities. Good symmetric motor tone is noted throughout.  Sensory: Sensory testing is intact to soft touch on all 4 extremities. No evidence of extinction is noted.  Coordination: Cerebellar testing reveals good finger-nose-finger and heel-to-shin bilaterally.  Gait and station: Gait is normal. Tandem gait is normal. Romberg is negative. No drift is seen.  Reflexes: Deep tendon reflexes are symmetric and normal bilaterally.   DIAGNOSTIC DATA (LABS, IMAGING, TESTING) - I reviewed patient records, labs, notes, testing and imaging myself where available.  Lab Results  Component Value Date   WBC 6.5 04/16/2017   HGB 11.4 04/16/2017   HCT 36.3 04/16/2017   MCV 73 (L) 04/16/2017   PLT 275 04/16/2017      Component Value Date/Time   NA 136 04/16/2017 1123   K 4.3 04/16/2017 1123   CL 101 04/16/2017 1123   CO2 21 04/16/2017 1123   GLUCOSE 89 04/16/2017 1123   GLUCOSE 91 04/26/2015 1048   BUN 8 04/16/2017 1123   CREATININE 0.62 04/16/2017 1123   CREATININE 0.64 04/26/2015 1048   CALCIUM 9.1 04/16/2017 1123   PROT 7.6 04/16/2017 1123   ALBUMIN 4.2 04/16/2017 1123   AST 13 04/16/2017 1123   ALT 14 04/16/2017 1123   ALKPHOS 135 (H) 04/16/2017 1123   BILITOT 0.3 04/16/2017 1123   GFRNONAA 113 04/16/2017 1123   GFRAA 130 04/16/2017 1123   Lab Results  Component Value Date   CHOL 161 04/16/2017   HDL 41 04/16/2017   LDLCALC 101  (H) 04/16/2017   TRIG 97 04/16/2017   CHOLHDL 3.9 04/16/2017   No results found for: HGBA1C No results found for: VITAMINB12 Lab Results  Component Value Date   TSH 1.210 04/16/2017      ASSESSMENT AND PLAN 42 y.o. year old female  has a past medical history of Allergy, Anemia, Environmental allergies, and Hypertension. here with:  1.  Obstructive sleep apnea on CPAP 2.  Pseudotumor cerebri  The patient CPAP download suboptimal compliance with good treatment of her apnea.  She is encouraged to try to use machine greater than 4 hours each night.  She will continue on Diamox for pseudotumor.  Advised that if her symptoms worsen or she develops new symptoms she should let us know.  She will follow-up in 6 months or sooner if needed.  I spent 15 minutes with the patient. 50% of this time was spent reviewing her CPAP download   Butch Penny, MSN, NP-C 11/07/2017, 10:45 AM [facility] [address] [place], [place] [address] [phone]  I reviewed the above note and documentation by the Nurse Practitioner and agree with the history, physical exam, assessment and plan as outlined above. I was immediately available for face-to-face consultation. [name], MD, PhD [facility])

## 2017-12-17 ENCOUNTER — Other Ambulatory Visit: Payer: Self-pay | Admitting: Neurology

## 2017-12-26 ENCOUNTER — Encounter (INDEPENDENT_AMBULATORY_CARE_PROVIDER_SITE_OTHER): Payer: BC Managed Care – PPO

## 2018-01-08 ENCOUNTER — Ambulatory Visit (INDEPENDENT_AMBULATORY_CARE_PROVIDER_SITE_OTHER): Payer: BC Managed Care – PPO | Admitting: Family Medicine

## 2018-01-08 ENCOUNTER — Encounter (INDEPENDENT_AMBULATORY_CARE_PROVIDER_SITE_OTHER): Payer: Self-pay | Admitting: Family Medicine

## 2018-01-08 VITALS — BP 108/72 | HR 87 | Temp 97.6°F | Ht 62.0 in | Wt 251.0 lb

## 2018-01-08 DIAGNOSIS — R5383 Other fatigue: Secondary | ICD-10-CM | POA: Diagnosis not present

## 2018-01-08 DIAGNOSIS — Z1331 Encounter for screening for depression: Secondary | ICD-10-CM

## 2018-01-08 DIAGNOSIS — G932 Benign intracranial hypertension: Secondary | ICD-10-CM | POA: Diagnosis not present

## 2018-01-08 DIAGNOSIS — Z9189 Other specified personal risk factors, not elsewhere classified: Secondary | ICD-10-CM

## 2018-01-08 DIAGNOSIS — E559 Vitamin D deficiency, unspecified: Secondary | ICD-10-CM | POA: Diagnosis not present

## 2018-01-08 DIAGNOSIS — R0602 Shortness of breath: Secondary | ICD-10-CM | POA: Diagnosis not present

## 2018-01-08 DIAGNOSIS — Z0289 Encounter for other administrative examinations: Secondary | ICD-10-CM

## 2018-01-08 DIAGNOSIS — Z6841 Body Mass Index (BMI) 40.0 and over, adult: Secondary | ICD-10-CM

## 2018-01-08 DIAGNOSIS — R011 Cardiac murmur, unspecified: Secondary | ICD-10-CM

## 2018-01-08 DIAGNOSIS — E66813 Obesity, class 3: Secondary | ICD-10-CM

## 2018-01-09 LAB — CBC WITH DIFFERENTIAL
Basophils Absolute: 0 10*3/uL (ref 0.0–0.2)
Basos: 1 %
EOS (ABSOLUTE): 0.1 10*3/uL (ref 0.0–0.4)
EOS: 1 %
HEMATOCRIT: 37.4 % (ref 34.0–46.6)
HEMOGLOBIN: 11.7 g/dL (ref 11.1–15.9)
Immature Grans (Abs): 0 10*3/uL (ref 0.0–0.1)
Immature Granulocytes: 0 %
LYMPHS ABS: 1.9 10*3/uL (ref 0.7–3.1)
Lymphs: 28 %
MCH: 22.5 pg — AB (ref 26.6–33.0)
MCHC: 31.3 g/dL — AB (ref 31.5–35.7)
MCV: 72 fL — AB (ref 79–97)
MONOCYTES: 7 %
MONOS ABS: 0.5 10*3/uL (ref 0.1–0.9)
NEUTROS PCT: 63 %
Neutrophils Absolute: 4.2 10*3/uL (ref 1.4–7.0)
RBC: 5.21 x10E6/uL (ref 3.77–5.28)
RDW: 14.2 % (ref 12.3–15.4)
WBC: 6.6 10*3/uL (ref 3.4–10.8)

## 2018-01-09 LAB — FOLATE: FOLATE: 6.4 ng/mL (ref >3.0)

## 2018-01-09 LAB — COMPREHENSIVE METABOLIC PANEL
ALK PHOS: 146 IU/L — AB (ref 39–117)
ALT: 5 IU/L (ref 0–32)
AST: 8 IU/L (ref 0–40)
Albumin/Globulin Ratio: 1.4 (ref 1.2–2.2)
Albumin: 4.3 g/dL (ref 3.5–5.5)
BILIRUBIN TOTAL: 0.4 mg/dL (ref 0.0–1.2)
BUN/Creatinine Ratio: 12 (ref 9–23)
BUN: 9 mg/dL (ref 6–24)
CHLORIDE: 107 mmol/L — AB (ref 96–106)
CO2: 17 mmol/L — AB (ref 20–29)
Calcium: 9.1 mg/dL (ref 8.7–10.2)
Creatinine, Ser: 0.74 mg/dL (ref 0.57–1.00)
GFR calc Af Amer: 116 mL/min/{1.73_m2} (ref >59)
GFR calc non Af Amer: 101 mL/min/{1.73_m2} (ref >59)
GLUCOSE: 90 mg/dL (ref 65–99)
Globulin, Total: 3.1 g/dL (ref 1.5–4.5)
Potassium: 4.1 mmol/L (ref 3.5–5.2)
Sodium: 137 mmol/L (ref 134–144)
TOTAL PROTEIN: 7.4 g/dL (ref 6.0–8.5)

## 2018-01-09 LAB — HEMOGLOBIN A1C
ESTIMATED AVERAGE GLUCOSE: 111 mg/dL
Hgb A1c MFr Bld: 5.5 % (ref 4.8–5.6)

## 2018-01-09 LAB — T4, FREE: Free T4: 0.91 ng/dL (ref 0.82–1.77)

## 2018-01-09 LAB — INSULIN, RANDOM: INSULIN: 12 u[IU]/mL (ref 2.6–24.9)

## 2018-01-09 LAB — TSH: TSH: 1.71 u[IU]/mL (ref 0.450–4.500)

## 2018-01-09 LAB — T3: T3, Total: 134 ng/dL (ref 71–180)

## 2018-01-09 LAB — VITAMIN B12: Vitamin B-12: 822 pg/mL (ref 232–1245)

## 2018-01-09 LAB — VITAMIN D 25 HYDROXY (VIT D DEFICIENCY, FRACTURES): Vit D, 25-Hydroxy: 9.9 ng/mL — ABNORMAL LOW (ref 30.0–100.0)

## 2018-01-13 NOTE — Progress Notes (Signed)
Office: [phone]  /  Fax: [phone]   Dear Mary Oar, PA-C,   Thank you for referring Mary Mcdaniel to our clinic. The following note includes my evaluation and treatment recommendations.  HPI:   Chief Complaint: OBESITY    Mary Mcdaniel has been referred by Mary Oar, PA-C for consultation regarding her obesity and obesity related comorbidities.    Mary Mcdaniel (MR# [id number]) is a 42 y.o. female who presents on 01/08/2018 for obesity evaluation and treatment. Current BMI is Body mass index is 45.91 kg/m.Mary Mcdaniel has been struggling with her weight for many years and has been unsuccessful in either losing weight, maintaining weight loss, or reaching her healthy weight goal.     Mary Mcdaniel attended our information session and states she is currently in the action stage of change and ready to dedicate time achieving and maintaining a healthier weight. Mary Mcdaniel is interested in becoming our patient and working on intensive lifestyle modifications including (but not limited to) diet, exercise and weight loss.    Mary Mcdaniel states her family eats meals together her desired weight loss is 52-71 lbs she has been heavy most of  her life she started gaining weight after college her heaviest weight ever was 255 lbs she is a picky eater and doesn't like to eat healthier foods  she has significant food cravings issues  she skips meals frequently she is frequently drinking liquids with calories she frequently makes poor food choices she struggles with emotional eating    Fatigue Mary Mcdaniel feels her energy is lower than it should be. This has worsened with weight gain and has not worsened recently. Mary Mcdaniel admits to daytime somnolence and  admits to waking up still tired. Patient has a history of obstructive sleep apnea with the use of CPAP. Patent has a history of symptoms of daytime fatigue. Patient generally gets 4 hours of sleep per night, and states they generally have  generally restful sleep with CPAP machine. Snoring is not present. Apneic episodes are not present. Epworth Sleepiness Score is 8.  Dyspnea on exertion Mary Mcdaniel notes increasing shortness of breath with exercising and seems to be worsening over time with weight gain. She notes getting out of breath sooner with activity than she used to. This has not gotten worse recently. Mary Mcdaniel denies orthopnea.  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 Her headaches have improved and no vision changes.  Vitamin D Deficiency Mary Mcdaniel has a diagnosis of vitamin D deficiency. She is not on Vit D, she notes fatigue and denies nausea, vomiting or muscle weakness.  New onset Heart Murmur Mary Mcdaniel has a new onset heart murmur, with shortness of breath with exercise. She denies chest pain and she has a history of sleep apnea.  At risk for cardiovascular disease Mary Mcdaniel is at a higher than average risk for cardiovascular disease due to obesity and heart murmur. She currently denies any chest pain.  Depression Screen Mary Mcdaniel's Food and Mood (modified PHQ-9) score was  Depression screen PHQ 2/9 01/08/2018  Decreased Interest 1  Down, Depressed, Hopeless 1  PHQ - 2 Score 2  Altered sleeping 3  Tired, decreased energy 3  Change in appetite 1  Feeling bad or failure about yourself  1  Trouble concentrating 1  Moving slowly or fidgety/restless 0  Suicidal thoughts 0  PHQ-9 Score 11  Difficult doing work/chores Not difficult at all    ALLERGIES: No Known Allergies  MEDICATIONS: Current Outpatient Medications  on File Prior to Visit  Medication Sig Dispense Refill  . acetaZOLAMIDE (DIAMOX) 250 MG tablet TAKE 2 TABLETS (500 MG TOTAL) BY MOUTH 2 (TWO) TIMES DAILY. 360 tablet 0  . fluticasone (FLONASE) 50 MCG/ACT nasal spray Place 2 sprays into both nostrils daily. 48 g 3  . levonorgestrel (MIRENA) 20 MCG/24HR IUD 1 each by  Intrauterine route once.    . loratadine (CLARITIN) 5 MG/5ML syrup Take by mouth daily.     No current facility-administered medications on file prior to visit.     PAST MEDICAL HISTORY: Past Medical History:  Diagnosis Date  . Allergy   . Anemia   . Constipation   . Dyspnea   . Environmental allergies   . Hypertension   . Pseudotumor cerebri   . Sleep apnea   . Vitamin D deficiency     PAST SURGICAL HISTORY: Past Surgical History:  Procedure Laterality Date  . BREAST SURGERY     reduction    SOCIAL HISTORY: Social History   Tobacco Use  . Smoking status: Never Smoker  . Smokeless tobacco: Never Used  Substance Use Topics  . Alcohol use: No    Alcohol/week: 0.0 standard drinks  . Drug use: No    FAMILY HISTORY: Family History  Problem Relation Age of Onset  . Cancer Mother        breast  . Hypertension Mother   . Macular degeneration Mother   . Hypertension Father   . Hyperlipidemia Father   . Diabetes Father   . Cancer Maternal Grandfather        lung  . Diabetes Paternal Grandmother   . Hypertension Maternal Grandmother   . Stroke Maternal Grandmother   . Autism Son     ROS: Review of Systems  Constitutional: Positive for malaise/fatigue. Negative for weight loss.  Eyes:       + Wear glasses or contacts  Respiratory: Positive for shortness of breath (with exertion).   Cardiovascular: Negative for chest pain and orthopnea.  Skin: Positive for rash.  Neurological: Positive for headaches.    PHYSICAL EXAM: Blood pressure 108/72, pulse 87, temperature 97.6 F (36.4 C), temperature source Oral, height 5\' 2"  (1.575 m), weight 251 lb (113.9 kg), SpO2 100 %. Body mass index is 45.91 kg/m. Physical Exam  Constitutional: She is oriented to person, place, and time. She appears well-developed and well-nourished.  HENT:  Head: Normocephalic and atraumatic.  Nose: Nose normal.  Eyes: EOM are normal. No scleral icterus.  Neck: Normal range of motion.  Neck supple. No thyromegaly present.  Cardiovascular: Normal rate and regular rhythm.  Murmur (1/6 early systolic murmur) heard. Pulmonary/Chest: Effort normal. No respiratory distress.  Abdominal: Soft. There is no tenderness.  + Obesity  Musculoskeletal:  Range of Motion normal in all 4 extremities Trace edema noted in bilateral lower extremities  Neurological: She is alert and oriented to person, place, and time. Coordination normal.  Skin: Skin is warm and dry.  Psychiatric: She has a normal mood and affect. Her behavior is normal.  Vitals reviewed.   RECENT LABS AND TESTS: BMET    Component Value Date/Time   NA 137 01/08/2018 1016   K 4.1 01/08/2018 1016   CL 107 (H) 01/08/2018 1016   CO2 17 (L) 01/08/2018 1016   GLUCOSE 90 01/08/2018 1016   GLUCOSE 91 04/26/2015 1048   BUN 9 01/08/2018 1016   CREATININE 0.74 01/08/2018 1016   CREATININE 0.64 04/26/2015 1048   CALCIUM 9.1 01/08/2018 1016  GFRNONAA 101 01/08/2018 1016   GFRAA 116 01/08/2018 1016   Lab Results  Component Value Date   HGBA1C 5.5 01/08/2018   Lab Results  Component Value Date   INSULIN 12.0 01/08/2018   CBC    Component Value Date/Time   WBC 6.6 01/08/2018 1016   WBC 7.6 04/26/2015 1048   RBC 5.21 01/08/2018 1016   RBC 5.05 04/26/2015 1048   HGB 11.7 01/08/2018 1016   HCT 37.4 01/08/2018 1016   PLT 275 04/16/2017 1123   MCV 72 (L) 01/08/2018 1016   MCH 22.5 (L) 01/08/2018 1016   MCH 22.8 (L) 04/26/2015 1048   MCHC 31.3 (L) 01/08/2018 1016   MCHC 31.3 04/26/2015 1048   RDW 14.2 01/08/2018 1016   LYMPHSABS 1.9 01/08/2018 1016   MONOABS 0.5 04/26/2015 1048   EOSABS 0.1 01/08/2018 1016   BASOSABS 0.0 01/08/2018 1016   Iron/TIBC/Ferritin/ %Sat No results found for: IRON, TIBC, FERRITIN, IRONPCTSAT Lipid Panel     Component Value Date/Time   CHOL 161 04/16/2017 1123   TRIG 97 04/16/2017 1123   HDL 41 04/16/2017 1123   CHOLHDL 3.9 04/16/2017 1123   CHOLHDL 3.9 04/26/2015 1048   VLDL  15 04/26/2015 1048   LDLCALC 101 (H) 04/16/2017 1123   Hepatic Function Panel     Component Value Date/Time   PROT 7.4 01/08/2018 1016   ALBUMIN 4.3 01/08/2018 1016   AST 8 01/08/2018 1016   ALT 5 01/08/2018 1016   ALKPHOS 146 (H) 01/08/2018 1016   BILITOT 0.4 01/08/2018 1016      Component Value Date/Time   TSH 1.710 01/08/2018 1016   TSH 1.210 04/16/2017 1123   TSH 1.69 04/26/2015 1048    ECG  shows NSR with a rate of 90 BPM INDIRECT CALORIMETER done today shows a VO2 of 216 and a REE of 1499.  Her calculated basal metabolic rate is 1610 thus her basal metabolic rate is worse than expected.    ASSESSMENT AND PLAN: Other fatigue - Plan: EKG 12-Lead, Vitamin B12, CBC With Differential, Folate, Hemoglobin A1c, Insulin, random, T3, T4, free, TSH  Shortness of breath on exertion - Plan: ECHOCARDIOGRAM COMPLETE  Pseudotumor cerebri - Plan: Comprehensive metabolic panel  Vitamin D deficiency - Plan: VITAMIN D 25 Hydroxy (Vit-D Deficiency, Fractures)  Murmur - Plan: ECHOCARDIOGRAM COMPLETE  Depression screening  At risk for heart disease  Class 3 severe obesity with serious comorbidity and body mass index (BMI) of 45.0 to 49.9 in adult, unspecified obesity type (HCC)  PLAN:  Fatigue Mary Mcdaniel was informed that her fatigue may be related to obesity, depression or many other causes. Labs will be ordered, and in the meanwhile Tanasha has agreed to work on diet, exercise and weight loss to help with fatigue. Proper sleep hygiene was discussed including the need for 7-8 hours of quality sleep each night. A sleep study was not ordered based on symptoms and Epworth score.  Dyspnea on exertion Mary Mcdaniel shortness of breath appears to be obesity related and exercise induced. She has agreed to work on weight loss and gradually increase exercise to treat her exercise induced shortness of breath. If Mary Mcdaniel follows our instructions and loses weight without improvement of her  shortness of breath, we will plan to refer to pulmonology. We will monitor this condition regularly. Katana agrees to this plan.  Pseudotumor Cerebri We will check labs and Mary Mcdaniel will continue working on weight loss, and will start diet prescription. Mary Mcdaniel agrees to follow up with our clinic in  2 weeks.  Vitamin D Deficiency Mary Mcdaniel was informed that low vitamin D levels contributes to fatigue and are associated with obesity, breast, and colon cancer. She will follow up for routine testing of vitamin D, at least 2-3 times per year. She was informed of the risk of over-replacement of vitamin D and agrees to not increase her dose unless she discusses this with [place] first. We will check labs and Mary Mcdaniel agrees to follow up with our clinic in 2 weeks.  New onset Heart Murmur We have sent a referral to [facility] for an echocardiogram, authorization# [id number] 01/08/18-02/06/18. Mary Mcdaniel agrees to follow up with our clinic in 2 weeks.  Cardiovascular risk counselling Mary Mcdaniel was given extended (15 minutes) coronary artery disease prevention counseling today. She is 42 y.o. female and has risk factors for heart disease including obesity and heart murmur. We discussed intensive lifestyle modifications today with an emphasis on specific weight loss instructions and strategies. Pt was also informed of the importance of increasing exercise and decreasing saturated fats to help prevent heart disease.  Depression Screen Mary Mcdaniel had a moderately positive depression screening. Depression is commonly associated with obesity and often results in emotional eating behaviors. We will monitor this closely and work on CBT to help improve the non-hunger eating patterns. Referral to Psychology may be required if no improvement is seen as she continues in our clinic.  Obesity Brindley is currently in the action stage of change and her goal is to continue with weight loss efforts. I recommend Brailee  begin the structured treatment plan as follows:  She has agreed to follow the Category 2 plan Haasini has been instructed to eventually work up to a goal of 150 minutes of combined cardio and strengthening exercise per week for weight loss and overall health benefits. We discussed the following Behavioral Modification Strategies today: increasing lean protein intake, decreasing simple carbohydrates  and work on meal planning and easy cooking plans   She was informed of the importance of frequent follow up visits to maximize her success with intensive lifestyle modifications for her multiple health conditions. She was informed we would discuss her lab results at her next visit unless there is a critical issue that needs to be addressed sooner. Mazikeen agreed to keep her next visit at the agreed upon time to discuss these results.    OBESITY BEHAVIORAL INTERVENTION VISIT  Today's visit was # 1   Starting weight: 251 lbs Starting date: 01/08/18 Today's weight : 251 lbs  Today's date: 01/08/2018 Total lbs lost to date: 0    ASK: We discussed the diagnosis of obesity with Olivia Mackie today and Sarenity agreed to give [place] permission to discuss obesity behavioral modification therapy today.  ASSESS: Helana has the diagnosis of obesity and her BMI today is 45.9 Shontia is in the action stage of change   ADVISE: Odella was educated on the multiple health risks of obesity as well as the benefit of weight loss to improve her health. She was advised of the need for long term treatment and the importance of lifestyle modifications to improve her current health and to decrease her risk of future health problems.  AGREE: Multiple dietary modification options and treatment options were discussed and  Darius agreed to follow the recommendations documented in the above note.  ARRANGE: Shary was educated on the importance of frequent visits to treat obesity as outlined per CMS and  USPSTF guidelines and agreed to schedule her next follow up appointment today.  I, Burt Knack, am acting as transcriptionist for Quillian Quince, MD   I have reviewed the above documentation for accuracy and completeness, and I agree with the above. -Quillian Quince, MD

## 2018-01-14 ENCOUNTER — Encounter (INDEPENDENT_AMBULATORY_CARE_PROVIDER_SITE_OTHER): Payer: Self-pay | Admitting: Family Medicine

## 2018-01-15 ENCOUNTER — Ambulatory Visit (HOSPITAL_COMMUNITY): Payer: BC Managed Care – PPO | Attending: Family Medicine

## 2018-01-15 ENCOUNTER — Other Ambulatory Visit: Payer: Self-pay

## 2018-01-15 DIAGNOSIS — R0602 Shortness of breath: Secondary | ICD-10-CM | POA: Diagnosis present

## 2018-01-15 DIAGNOSIS — R011 Cardiac murmur, unspecified: Secondary | ICD-10-CM | POA: Insufficient documentation

## 2018-01-23 ENCOUNTER — Ambulatory Visit (INDEPENDENT_AMBULATORY_CARE_PROVIDER_SITE_OTHER): Payer: BC Managed Care – PPO | Admitting: Family Medicine

## 2018-01-23 VITALS — BP 121/83 | HR 89 | Temp 97.9°F | Ht 62.0 in | Wt 252.0 lb

## 2018-01-23 DIAGNOSIS — R7303 Prediabetes: Secondary | ICD-10-CM | POA: Diagnosis not present

## 2018-01-23 DIAGNOSIS — Z6841 Body Mass Index (BMI) 40.0 and over, adult: Secondary | ICD-10-CM

## 2018-01-23 DIAGNOSIS — E559 Vitamin D deficiency, unspecified: Secondary | ICD-10-CM | POA: Diagnosis not present

## 2018-01-23 DIAGNOSIS — Z9189 Other specified personal risk factors, not elsewhere classified: Secondary | ICD-10-CM

## 2018-01-23 MED ORDER — VITAMIN D (ERGOCALCIFEROL) 1.25 MG (50000 UNIT) PO CAPS: 50000.0000 [IU] | ORAL_CAPSULE | ORAL | 0 refills | Status: AC

## 2018-01-23 MED ORDER — METFORMIN HCL 500 MG PO TABS: 500.0000 mg | ORAL_TABLET | Freq: Every day | ORAL | 0 refills | Status: DC

## 2018-01-27 NOTE — Progress Notes (Signed)
Office: [phone]  /  Fax: [phone]   HPI:   Chief Complaint: OBESITY Mary Mcdaniel is here to discuss her progress with her obesity treatment plan. She is on the Category 2 plan and is following her eating plan approximately 40 % of the time. She states she is exercising 0 minutes 0 times per week. Mary Mcdaniel did well with diet prescription for the first few days, but struggled with meal planning and then was tempted (unexpectedly) by Halloween candy.  Her weight is 252 lb (114.3 kg) today and has gained 1 pound since her last visit. She has lost 0 lbs since starting treatment with Mary Mcdaniel.  Vitamin D Deficiency Mary Mcdaniel has a new diagnosis of vitamin D deficiency. She is not on Vit D and her Vit D level is low. She notes fatigue and denies nausea, vomiting or muscle weakness.  Pre-Diabetes Mary Mcdaniel has a new diagnosis of pre-diabetes based on her elevated Hgb A1c and fasting insulin >5 and was informed this puts her at greater risk of developing diabetes. She is not taking metformin currently and she notes polyphagia, worse in the PM. She continues to work on diet and exercise to decrease risk of diabetes. She denies hypoglycemia.  At risk for diabetes Mary Mcdaniel is at higher than average risk for developing diabetes due to her obesity and pre-diabetes. She currently denies polyuria or polydipsia.  ALLERGIES: No Known Allergies  MEDICATIONS: Current Outpatient Medications on File Prior to Visit  Medication Sig Dispense Refill  . acetaZOLAMIDE (DIAMOX) 250 MG tablet TAKE 2 TABLETS (500 MG TOTAL) BY MOUTH 2 (TWO) TIMES DAILY. 360 tablet 0  . fluticasone (FLONASE) 50 MCG/ACT nasal spray Place 2 sprays into both nostrils daily. 48 g 3  . levonorgestrel (MIRENA) 20 MCG/24HR IUD 1 each by Intrauterine route once.    . loratadine (CLARITIN) 5 MG/5ML syrup Take by mouth daily.     No current facility-administered medications on file prior to visit.     PAST MEDICAL HISTORY: Past Medical  History:  Diagnosis Date  . Allergy   . Anemia   . Constipation   . Dyspnea   . Environmental allergies   . Hypertension   . Pseudotumor cerebri   . Sleep apnea   . Vitamin D deficiency     PAST SURGICAL HISTORY: Past Surgical History:  Procedure Laterality Date  . BREAST SURGERY     reduction    SOCIAL HISTORY: Social History   Tobacco Use  . Smoking status: Never Smoker  . Smokeless tobacco: Never Used  Substance Use Topics  . Alcohol use: No    Alcohol/week: 0.0 standard drinks  . Drug use: No    FAMILY HISTORY: Family History  Problem Relation Age of Onset  . Cancer Mother        breast  . Hypertension Mother   . Macular degeneration Mother   . Hypertension Father   . Hyperlipidemia Father   . Diabetes Father   . Cancer Maternal Grandfather        lung  . Diabetes Paternal Grandmother   . Hypertension Maternal Grandmother   . Stroke Maternal Grandmother   . Autism Son     ROS: Review of Systems  Constitutional: Positive for malaise/fatigue. Negative for weight loss.  Gastrointestinal: Negative for nausea and vomiting.  Genitourinary: Negative for frequency.  Musculoskeletal:       Negative muscle weakness  Endo/Heme/Allergies: Negative for polydipsia.       Positive polyphagia Negative hypoglycemia    PHYSICAL EXAM:  Blood pressure 121/83, pulse 89, temperature 97.9 F (36.6 C), temperature source Oral, height 5\' 2"  (1.575 m), weight 252 lb (114.3 kg), SpO2 100 %. Body mass index is 46.09 kg/m. Physical Exam  Constitutional: She is oriented to person, place, and time. She appears well-developed and well-nourished.  Cardiovascular: Normal rate.  Pulmonary/Chest: Effort normal.  Musculoskeletal: Normal range of motion.  Neurological: She is oriented to person, place, and time.  Skin: Skin is warm and dry.  Psychiatric: She has a normal mood and affect. Her behavior is normal.  Vitals reviewed.   RECENT LABS AND TESTS: BMET    Component  Value Date/Time   NA 137 01/08/2018 1016   K 4.1 01/08/2018 1016   CL 107 (H) 01/08/2018 1016   CO2 17 (L) 01/08/2018 1016   GLUCOSE 90 01/08/2018 1016   GLUCOSE 91 04/26/2015 1048   BUN 9 01/08/2018 1016   CREATININE 0.74 01/08/2018 1016   CREATININE 0.64 04/26/2015 1048   CALCIUM 9.1 01/08/2018 1016   GFRNONAA 101 01/08/2018 1016   GFRAA 116 01/08/2018 1016   Lab Results  Component Value Date   HGBA1C 5.5 01/08/2018   Lab Results  Component Value Date   INSULIN 12.0 01/08/2018   CBC    Component Value Date/Time   WBC 6.6 01/08/2018 1016   WBC 7.6 04/26/2015 1048   RBC 5.21 01/08/2018 1016   RBC 5.05 04/26/2015 1048   HGB 11.7 01/08/2018 1016   HCT 37.4 01/08/2018 1016   PLT 275 04/16/2017 1123   MCV 72 (L) 01/08/2018 1016   MCH 22.5 (L) 01/08/2018 1016   MCH 22.8 (L) 04/26/2015 1048   MCHC 31.3 (L) 01/08/2018 1016   MCHC 31.3 04/26/2015 1048   RDW 14.2 01/08/2018 1016   LYMPHSABS 1.9 01/08/2018 1016   MONOABS 0.5 04/26/2015 1048   EOSABS 0.1 01/08/2018 1016   BASOSABS 0.0 01/08/2018 1016   Iron/TIBC/Ferritin/ %Sat No results found for: IRON, TIBC, FERRITIN, IRONPCTSAT Lipid Panel     Component Value Date/Time   CHOL 161 04/16/2017 1123   TRIG 97 04/16/2017 1123   HDL 41 04/16/2017 1123   CHOLHDL 3.9 04/16/2017 1123   CHOLHDL 3.9 04/26/2015 1048   VLDL 15 04/26/2015 1048   LDLCALC 101 (H) 04/16/2017 1123   Hepatic Function Panel     Component Value Date/Time   PROT 7.4 01/08/2018 1016   ALBUMIN 4.3 01/08/2018 1016   AST 8 01/08/2018 1016   ALT 5 01/08/2018 1016   ALKPHOS 146 (H) 01/08/2018 1016   BILITOT 0.4 01/08/2018 1016      Component Value Date/Time   TSH 1.710 01/08/2018 1016   TSH 1.210 04/16/2017 1123   TSH 1.69 04/26/2015 1048  Results for GENEEN, DIETER (MRN [id number]) as of 01/27/2018 08:13  Ref. Range 01/08/2018 10:16  Vitamin D, 25-Hydroxy Latest Ref Range: 30.0 - 100.0 ng/mL 9.9 (L)    ASSESSMENT AND PLAN: Vitamin D  deficiency - Plan: Vitamin D, Ergocalciferol, (DRISDOL) 1.25 MG (50000 UT) CAPS capsule  Pre-diabetes - Plan: metFORMIN (GLUCOPHAGE) 500 MG tablet  At risk for diabetes mellitus  Class 3 severe obesity with serious comorbidity and body mass index (BMI) of 45.0 to 49.9 in adult, unspecified obesity type (HCC)  PLAN:  Vitamin D Deficiency Mary Mcdaniel was informed that low vitamin D levels contributes to fatigue and are associated with obesity, breast, and colon cancer. Mary Mcdaniel agrees to start prescription Vit D @50 ,000 IU every week #4 with no refills. She will follow up for routine  testing of vitamin D, at least 2-3 times per year. She was informed of the risk of over-replacement of vitamin D and agrees to not increase her dose unless she discusses this with Mary Mcdaniel first. Mary Mcdaniel agrees to follow up with our clinic in 2 weeks.  Pre-Diabetes Mary Mcdaniel will continue to work on weight loss, exercise, and decreasing simple carbohydrates in her diet to help decrease the risk of diabetes. We dicussed metformin including benefits and risks. She was informed that eating too many simple carbohydrates or too many calories at one sitting increases the likelihood of GI side effects. Mary Mcdaniel agrees to start metformin 500 mg q AM #30 with no refills. Mary Mcdaniel agrees to follow up with our clinic in 2 weeks as directed to monitor her progress.  Diabetes risk counselling Mary Mcdaniel was given extended (30 minutes) diabetes prevention counseling today. She is 43 y.Mary Mcdaniel. female and has risk factors for diabetes including obesity and pre-diabetes. We discussed intensive lifestyle modifications today with an emphasis on weight loss as well as increasing exercise and decreasing simple carbohydrates in her diet.  Obesity Mary Mcdaniel is currently in the action stage of change. As such, her goal is to continue with weight loss efforts She has agreed to follow the Category 2 plan Mary Mcdaniel has been instructed to work up to a goal of  150 minutes of combined cardio and strengthening exercise per week for weight loss and overall health benefits. We discussed the following Behavioral Modification Strategies today: increasing lean protein intake, decrease eating out, work on meal planning and easy cooking plans, and no skipping meals   Mary Mcdaniel has agreed to follow up with our clinic in 2 weeks. She was informed of the importance of frequent follow up visits to maximize her success with intensive lifestyle modifications for her multiple health conditions.   OBESITY BEHAVIORAL INTERVENTION VISIT  Today's visit was # 2   Starting weight: 251 lbs Starting date: 01/08/18 Today's weight : 252 lbs  Today's date: 01/23/2018 Total lbs lost to date: 0    ASK: We discussed the diagnosis of obesity with Mary Mcdaniel today and Mary Mcdaniel agreed to give Mary Mcdaniel permission to discuss obesity behavioral modification therapy today.  ASSESS: Mary Mcdaniel has the diagnosis of obesity and her BMI today is 46.08 Mary Mcdaniel is in the action stage of change   ADVISE: Mary Mcdaniel was educated on the multiple health risks of obesity as well as the benefit of weight loss to improve her health. She was advised of the need for long term treatment and the importance of lifestyle modifications to improve her current health and to decrease her risk of future health problems.  AGREE: Multiple dietary modification options and treatment options were discussed and  Mary Mcdaniel agreed to follow the recommendations documented in the above note.  ARRANGE: Mary Mcdaniel was educated on the importance of frequent visits to treat obesity as outlined per CMS and USPSTF guidelines and agreed to schedule her next follow up appointment today.  I, Burt Knack, am acting as transcriptionist for Quillian Quince, MD  I have reviewed the above documentation for accuracy and completeness, and I agree with the above. -Quillian Quince, MD

## 2018-02-06 ENCOUNTER — Encounter (INDEPENDENT_AMBULATORY_CARE_PROVIDER_SITE_OTHER): Payer: Self-pay

## 2018-02-06 ENCOUNTER — Ambulatory Visit (INDEPENDENT_AMBULATORY_CARE_PROVIDER_SITE_OTHER): Payer: BC Managed Care – PPO | Admitting: Family Medicine

## 2018-02-15 ENCOUNTER — Other Ambulatory Visit (INDEPENDENT_AMBULATORY_CARE_PROVIDER_SITE_OTHER): Payer: Self-pay | Admitting: Family Medicine

## 2018-02-15 DIAGNOSIS — E559 Vitamin D deficiency, unspecified: Secondary | ICD-10-CM

## 2018-02-17 ENCOUNTER — Other Ambulatory Visit (INDEPENDENT_AMBULATORY_CARE_PROVIDER_SITE_OTHER): Payer: Self-pay | Admitting: Family Medicine

## 2018-02-17 DIAGNOSIS — R7303 Prediabetes: Secondary | ICD-10-CM

## 2018-04-27 ENCOUNTER — Other Ambulatory Visit: Payer: Self-pay | Admitting: Neurology

## 2018-04-29 ENCOUNTER — Encounter: Payer: Self-pay | Admitting: Neurology

## 2018-04-29 LAB — HM DIABETES EYE EXAM

## 2018-05-07 ENCOUNTER — Encounter: Payer: Self-pay | Admitting: Neurology

## 2018-05-12 ENCOUNTER — Ambulatory Visit (INDEPENDENT_AMBULATORY_CARE_PROVIDER_SITE_OTHER): Payer: BC Managed Care – PPO | Admitting: Neurology

## 2018-05-12 ENCOUNTER — Encounter: Payer: Self-pay | Admitting: Neurology

## 2018-05-12 VITALS — BP 152/98 | HR 81 | Ht 63.0 in | Wt 255.0 lb

## 2018-05-12 DIAGNOSIS — G4733 Obstructive sleep apnea (adult) (pediatric): Secondary | ICD-10-CM

## 2018-05-12 DIAGNOSIS — Z9989 Dependence on other enabling machines and devices: Secondary | ICD-10-CM | POA: Diagnosis not present

## 2018-05-12 DIAGNOSIS — G932 Benign intracranial hypertension: Secondary | ICD-10-CM | POA: Diagnosis not present

## 2018-05-12 MED ORDER — ACETAZOLAMIDE ER 500 MG PO CP12: 500.0000 mg | ORAL_CAPSULE | Freq: Two times a day (BID) | ORAL | 5 refills | Status: DC

## 2018-05-12 NOTE — Patient Instructions (Signed)
Your eye nerve swelling is better, thankfully.  Please continue to work on weight loss with the [facility] weight management team.  Please continue with the Diamox, I am changing your prescription to 500 mg capsules, take 1 twice daily.   Please continue using your CPAP regularly. While your insurance requires that you use CPAP at least 4 hours each night on 70% of the nights, I recommend, that you not skip any nights and use it throughout the night if you can. Getting used to CPAP and staying with the treatment long term does take time and patience and discipline. Untreated obstructive sleep apnea when it is moderate to severe can have an adverse impact on cardiovascular health and raise her risk for heart disease, arrhythmias, hypertension, congestive heart failure, stroke and diabetes. Untreated obstructive sleep apnea causes sleep disruption, nonrestorative sleep, and sleep deprivation. This can have an impact on your day to day functioning and cause daytime sleepiness and impairment of cognitive function, memory loss, mood disturbance, and problems focussing. Using CPAP regularly can improve these symptoms.

## 2018-05-12 NOTE — Progress Notes (Signed)
Subjective:    Patient ID: Mary Mcdaniel is a 43 y.o. female.  HPI     Interim history:   Ms. Mary Mcdaniel is a 43 year old right-handed woman with an underlying medical history of hypertension, allergies, anemia, who since for follow-up consultation of her pseudotumor cerebri and sleep apnea. The patient is unaccompanied today. I first met her on 04/30/2017 at the request of her optometrist, at which time she was referred for incidental finding of bilateral papilledema, left more than right. She did report recurrent headaches and sleep disturbance including snoring and morning headaches. She was advised to proceed with a lumbar puncture. She had this on 05/08/2017 with an elevated opening pressure of 37 at the time. She was advised to start Diamox. She also was advised to have a sleep study. She had a baseline sleep study, followed by a CPAP titration study. Baseline sleep study from 06/02/2017 showed moderate to severe obstructive sleep apnea with a total AHI of 17 per hour, REM AHI of 43.9 per hour and supine AHI of 25.4 per hour, O2 nadir of 68%. She had a CPAP titration study on 07/03/2017 and had good results with a CPAP pressure of 14 cm with an AHI of 2.3 per hour, with supine REM sleep achieved and O2 nadir of 94%. Based on her sleep study results I prescribed CPAP therapy for home use at a pressure of 14 cm.  She saw Ward Givens in the interim on 11/07/2017, at which time she was on Diamox. She was using her CPAP nearly nightly but did not stay on it very long each night for her compliance percentage for more than 4 hours of only 33%.   Today, 05/12/2018: I reviewed her CPAP compliance data from 04/08/2018 through 05/07/2018 which is a total of 30 days, during which time she used her machine 27 days with percent used days greater than 4 hours at 57%, indicating suboptimal compliance with an average usage of 4 hours and 19 minutes for days on treatment. Residual AHI at goal at 0.6 per hour,  leak low with the 95th percentile at 4.6 L/m on a pressure of 14 cm with EPR of 3. She reports that she had an interim headache recently but it resolved. CPAP is going fairly well. She is taking her Diamox, but has had some tingling in the face and hands and taste changes, all tolerable. Has had some difficulty with swallowing the tablets, as they are chalky. She had a recent headache, which lasted all weekend, ibuprofen helped. She has been going to weight management through [facility] for the past 3 months, has lost about 10 lb thus far. She had an interim on examination with Dr. Nicki Reaper on 04/29/2018 with resolution of papilledema noted on the right and improved papilledema noted on the left. She is advised to follow-up in one year with her eye doctor. The patient's allergies, current medications, family history, past medical history, past social history, past surgical history and problem list were reviewed and updated as appropriate.   Previously:  04/30/2017: (She) was noted to have bilateral papilledema (per your report L>R) during her routine eye exam on 04/24/2017, which was nearly 2 years after her previous exam. She had no particular Sx at the time. She has a Hx of migraines, but these improved after her pregnancies; she has a 42 yo and 42 yo. She used to use imitrex, but now uses Advil prn. She has consistently gained weight. 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 I reviewed your office note from 04/24/2017 which you kindly included. Thankfully, she had no significant visual deficits or visual acuity deficit, no change in her glasses Rx for myopia. She does report of recurrent headaches and BMI is in the morbidly obese range of about 40. Her Epworth sleepiness score is 5 out of 24, fatigue score is a female 48. She lives at home with her husband and 2 children. She is a nonsmoker and does not utilize alcohol, drinks caffeine in the form of soda, one to 2 small  bottles per day on average. She does not have night to night nocturia but has woken up with a headache. She admits that she does not always get 7-8 hours of sleep, goes to bed late, sometimes after midnight and rise time is around 6. She occasionally takes a nap. Snoring is reportedly loud at times. In the past several years she has perpetually gained weight. She has not worked hard on weight loss she admits.   Her Past Medical History Is Significant For: Past Medical History:  Diagnosis Date  . Allergy   . Anemia   . Constipation   . Dyspnea   . Environmental allergies   . Hypertension   . Pseudotumor cerebri   . Sleep apnea   . Vitamin D deficiency     Her Past Surgical History Is Significant For: Past Surgical History:  Procedure Laterality Date  . BREAST SURGERY     reduction    Her Family History Is Significant For: Family History  Problem Relation Age of Onset  . Cancer Mother        breast  . Hypertension Mother   . Macular degeneration Mother   . Hypertension Father   . Hyperlipidemia Father   . Diabetes Father   . Cancer Maternal Grandfather        lung  . Diabetes Paternal Grandmother   . Hypertension Maternal Grandmother   . Stroke Maternal Grandmother   . Autism Son     Her Social History Is Significant For: Social History   Socioeconomic History  . Marital status: Married    Spouse name: Hiromi Knodel  . Number of children: 2  . Years of education: Master's  . Highest education level: Not on file  Occupational History  . 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 Financial resource strain: Not on file  . Food insecurity:    Worry: Not on file    Inability: Not on file  . Transportation needs:    Medical: Not on file    Non-medical: Not on file  Tobacco Use  . Smoking status: Never Smoker  . Smokeless tobacco: Never Used  Substance and Sexual Activity  . Alcohol use: No     Alcohol/week: 0.0 standard drinks  . Drug use: No  . Sexual activity: Yes    Partners: Male    Birth control/protection: I.U.D.    Comment: Mirena 08/2012  Lifestyle  . Physical activity:    Days per week: Not on file    Minutes per session: Not on file  . Stress: Not on file  Relationships  . Social connections:    Talks on phone: Not on file    Gets together: Not on file    Attends religious service: Not on file    Active member of club or organization: Not on file    Attends meetings of clubs  or organizations: Not on file    Relationship status: Not on file  Other Topics Concern  . Not on file  Social History Narrative   Married to husband Thurmond Butts, two biological kids, daughter Merrilyn Puma (2008) and son Thurmond Butts (2014), and an adopted daughter Ubaldo Glassing. Thurmond Butts also has a daughter, Delilah Shan, from a previous relationship.     Her Allergies Are:  No Known Allergies:   Her Current Medications Are:  Outpatient Encounter Medications as of 05/12/2018  Medication Sig  . acetaZOLAMIDE (DIAMOX) 250 MG tablet TAKE 2 TABLETS (500 MG TOTAL) BY MOUTH 2 (TWO) TIMES DAILY.  . fluticasone (FLONASE) 50 MCG/ACT nasal spray Place 2 sprays into both nostrils daily.  Marland Kitchen levonorgestrel (MIRENA) 20 MCG/24HR IUD 1 each by Intrauterine route once.  . loratadine (CLARITIN) 5 MG/5ML syrup Take by mouth daily.  . Vitamin D, Ergocalciferol, (DRISDOL) 1.25 MG (50000 UT) CAPS capsule Take 1 capsule (50,000 Units total) by mouth every 7 (seven) days.  . [DISCONTINUED] metFORMIN (GLUCOPHAGE) 500 MG tablet Take 1 tablet (500 mg total) by mouth daily with breakfast.   No facility-administered encounter medications on file as of 05/12/2018.   :  Review of Systems:  Out of a complete 14 point review of systems, all are reviewed and negative with the exception of these symptoms as listed below: Review of Systems  Neurological:       Pt presents today for follow up. Pt reports that she had a headache for the past two days but  it resolved today. Pt reports that her cpap is going well. Pt has noticed no visual changes.    Objective:  Neurological Exam  Physical Exam Physical Examination:   Vitals:   05/12/18 1505  BP: (!) 152/98  Pulse: 81    General Examination: The patient is a very pleasant 43 y.o. female in no acute distress. She appears well-developed and well-nourished and well groomed.   HEENT: Normocephalic, atraumatic, pupils are equal, round and reactive to light and accommodation. Funduscopic exam shows benign findings. Extraocular tracking is good without limitation to gaze excursion or nystagmus noted. Normal smooth pursuit is noted. Hearing is grossly intact. Face is symmetric with normal facial animation and normal facial sensation. Speech is clear with no dysarthria noted. There is no hypophonia. There is no lip, neck/head, jaw or voice tremor. Neck with FROM. Oropharynx exam reveals: mild mouth dryness, good dental hygiene and marked airway crowding. Tongue protrudes centrally and palate elevates symmetrically.   Chest: Clear to auscultation without wheezing, rhonchi or crackles noted.  Heart: S1+S2+0, regular and normal without murmurs, rubs or gallops noted.   Abdomen: Soft, non-tender and non-distended with normal bowel sounds appreciated on auscultation.  Extremities: There is no pitting edema in the distal lower extremities bilaterally.   Skin: Warm and dry without trophic changes noted.  Musculoskeletal: exam reveals no obvious joint deformities, tenderness or joint swelling or erythema.   Neurologically:  Mental status: The patient is awake, alert and oriented in all 4 spheres. Her immediate and remote memory, attention, language skills and fund of knowledge are appropriate. There is no evidence of aphasia, agnosia, apraxia or anomia. Speech is clear with normal prosody and enunciation. Thought process is linear. Mood is normal and affect is normal.  Cranial nerves II - XII are  as described above under HEENT exam.  Motor exam: Normal bulk, strength and tone is noted. There is no tremor. Romberg is negative. Fine motor skills and coordination: intact grossly.   Cerebellar testing: No  dysmetria or intention tremor. There is no truncal or gait ataxia.  Sensory exam: intact to light touch, pinprick, vibration in the upper and lower extremities.  Gait, station and balance: She stands easily. No veering to one side is noted. No leaning to one side is noted. Posture is age-appropriate and stance is narrow based. Gait shows normal stride length and normal pace. No problems turning are noted. Tandem walk is unremarkable.  Assessment and Plan:  In summary, Mayerly Kaman is a very pleasant 43 year old female with an underlying medical history of hypertension, allergies, anemia, who presents for FU consultation of her pseudotumor cerebri. Her lumbar puncture from 05/08/2017 showed an elevated opening pressure of 37 cm. Her papilledema has improved, she had a recent eye examination this month. She was also found to have moderate to severe obstructive sleep apnea by sleep study testing in March 2019. She has been on CPAP therapy with improved compliance. She reports that she often does not sleep more than 5 hours. She is strongly advised to try to make more time for sleep, 7-8 hours are recommended for the average adult. She is working on weight loss with the medical weight loss program through [facility]. She has lost about 10 pounds thus far. She is encouraged to continue with full compliance with her CPAP. She does endorse better sleep consolidation and less daytime tiredness and less headaches since starting CPAP therapy. She is compliant with Diamox but has had some trouble swallowing the pills. I changed the prescription to 500 mg capsules. She is advised to continue with 500 mg twice a day. She is encouraged to follow-up routinely with Ward Givens, nurse practitioner in 6 months, sooner if  needed. I answered all her questions today and she was in agreement. I spent 25 minutes in total face-to-face time with the patient, more than 50% of which was spent in counseling and coordination of care, reviewing test results, reviewing medication and discussing or reviewing the diagnosis of PTC, OSA, the prognosis and treatment options. Pertinent laboratory and imaging test results that were available during this visit with the patient were reviewed by me and considered in my medical decision making (see chart for details).

## 2018-11-02 ENCOUNTER — Other Ambulatory Visit: Payer: Self-pay | Admitting: Neurology

## 2018-11-11 ENCOUNTER — Telehealth: Payer: Self-pay | Admitting: Neurology

## 2018-11-11 DIAGNOSIS — G4733 Obstructive sleep apnea (adult) (pediatric): Secondary | ICD-10-CM

## 2018-11-11 NOTE — Addendum Note (Signed)
Addended by: Star Age on: 11/11/2018 12:52 PM   Modules accepted: Orders

## 2018-11-11 NOTE — Telephone Encounter (Signed)
I called pt. She reports that since her weight loss surgyer on 09/17/2018 she has lost 34 lbs. She is experiencing "burping" and she thinks the pressure may be too high on her cpap. She is wondering if the pressure can be reduced.

## 2018-11-11 NOTE — Telephone Encounter (Signed)
I called pt to discuss. No answer, left a message asking her to call me back. 

## 2018-11-11 NOTE — Telephone Encounter (Signed)
Pt returned my call. I discussed these recommendations with her.   Appt was moved to 12/30/18 with Amy,NP.  Pt understands that although there may be some limitations with this type of visit, we will take all precautions to reduce any security or privacy concerns.  Pt understands that this will be treated like an in office visit and we will file with pt's insurance, and there may be a patient responsible charge related to this service.  Pt verbalized understanding of recommendations and of appt date and time. Order for pressure change sent to [facility] via community message. Confirmation received that the order transmitted was successful.

## 2018-11-11 NOTE — Telephone Encounter (Signed)
>  I reviewed patient's recent 30-day and 90-day compliance data on CPAP of 14 cm, lately she has only used her machine 10 out of 30 days.  Her residual AHI is around 0.3/h for the past 90 days, during which time she used her 4 days.  We can certainly try to reduce her pressure given her weight loss surgery and weight loss achieved thus far.  I recommend that we reduce her pressure to 10 cm, currently she is on 14 cm.  Please send order to her DME company and advised patient.

## 2018-11-11 NOTE — Telephone Encounter (Signed)
Pt states she is needing her cpap pressure adjusted. Please advise.

## 2018-11-18 ENCOUNTER — Ambulatory Visit: Payer: BC Managed Care – PPO | Admitting: Adult Health

## 2018-12-30 ENCOUNTER — Encounter: Payer: Self-pay | Admitting: Family Medicine

## 2018-12-30 ENCOUNTER — Telehealth (INDEPENDENT_AMBULATORY_CARE_PROVIDER_SITE_OTHER): Payer: BC Managed Care – PPO | Admitting: Family Medicine

## 2018-12-30 DIAGNOSIS — Z9989 Dependence on other enabling machines and devices: Secondary | ICD-10-CM | POA: Diagnosis not present

## 2018-12-30 DIAGNOSIS — G4733 Obstructive sleep apnea (adult) (pediatric): Secondary | ICD-10-CM | POA: Diagnosis not present

## 2018-12-30 DIAGNOSIS — G932 Benign intracranial hypertension: Secondary | ICD-10-CM

## 2018-12-30 NOTE — Progress Notes (Addendum)
PATIENT: Mary Mcdaniel DOB: 1975-06-19  REASON FOR VISIT: follow up HISTORY FROM: patient  Virtual Visit via Telephone Note  I connected with Mary Mcdaniel on 12/30/18 at  9:00 AM EDT by telephone and verified that I am speaking with the correct person using two identifiers.   I discussed the limitations, risks, security and privacy concerns of performing an evaluation and management service by telephone and the availability of in person appointments. I also discussed with the patient that there may be a patient responsible charge related to this service. The patient expressed understanding and agreed to proceed.   History of Present Illness:  12/30/18 Mary Mcdaniel is a 43 y.o. female here today for follow up of IIH and OSA on CPAP.  She reports that headaches are well managed.  Last headache was over 2 weeks ago.  She feels that headaches are easily aborted.  She denies any vision changes.  She continues Diamox 500 mg twice daily.  She is scheduled for ophthalmology revisit in February.  She is doing better with CPAP pressure.  Pressure decreased from 14 cm of water to 10 cm of water following bariatric procedure.  She feels that this is much more tolerable.  She has lost approximately 50 pounds.  She feels that she is doing very well.  Compliance report dated 11/29/2018 through 12/28/2018 reveals that she used CPAP 23 of the last 30 days for compliance of 77%.  12 days she used CPAP greater than 4 hours for compliance of 40%.  Average usage was 4 hours and 12 minutes.  AHI was 0.4 on 10 cm of water and an EPR of 3.  There was no significant leak noted.   History (copied from Dr Guadelupe Sabin note on 05/12/2018)  Interim history:   Mary Mcdaniel is a 43 year old right-handed woman with an underlying medical history of hypertension, allergies, anemia, who since for follow-up consultation of her pseudotumor cerebri and sleep apnea. The patient is unaccompanied today. I first met her on  04/30/2017 at the request of her optometrist, at which time she was referred for incidental finding of bilateral papilledema, left more than right. She did report recurrent headaches and sleep disturbance including snoring and morning headaches. She was advised to proceed with a lumbar puncture. She had this on 05/08/2017 with an elevated opening pressure of 37 at the time. She was advised to start Diamox. She also was advised to have a sleep study. She had a baseline sleep study, followed by a CPAP titration study. Baseline sleep study from 06/02/2017 showed moderate to severe obstructive sleep apnea with a total AHI of 17 per hour, REM AHI of 43.9 per hour and supine AHI of 25.4 per hour, O2 nadir of 68%. She had a CPAP titration study on 07/03/2017 and had good results with a CPAP pressure of 14 cm with an AHI of 2.3 per hour, with supine REM sleep achieved and O2 nadir of 94%. Based on her sleep study results I prescribed CPAP therapy for home use at a pressure of 14 cm.  She saw Mary Mcdaniel in the interim on 11/07/2017, at which time she was on Diamox. She was using her CPAP nearly nightly but did not stay on it very long each night for her compliance percentage for more than 4 hours of only 33%.   Today, 05/12/2018: I reviewed her CPAP compliance data from 04/08/2018 through 05/07/2018 which is a total of 30 days, during which time she used her machine 27 days with  percent used days greater than 4 hours at 57%, indicating suboptimal compliance with an average usage of 4 hours and 19 minutes for days on treatment. Residual AHI at goal at 0.6 per hour, leak low with the 95th percentile at 4.6 L/m on a pressure of 14 cm with EPR of 3. She reports that she had an interim headache recently but it resolved. CPAP is going fairly well. She is taking her Diamox, but has had some tingling in the face and hands and taste changes, all tolerable. Has had some difficulty with swallowing the tablets, as they are  chalky. She had a recent headache, which lasted all weekend, ibuprofen helped. She has been going to weight management through [facility] for the past 3 months, has lost about 10 lb thus far. She had an interim on examination with Dr. Nicki Reaper on 04/29/2018 with resolution of papilledema noted on the right and improved papilledema noted on the left. She is advised to follow-up in one year with her eye doctor. The patient's allergies, current medications, family history, past medical history, past social history, past surgical history and problem list were reviewed and updated as appropriate.   Previously:  04/30/2017: (She) was noted to have bilateral papilledema (per your report L>R) during her routine eye exam on 04/24/2017, which was nearly 2 years after her previous exam. She had no particular Sx at the time. She has a Hx of migraines, but these improved after her pregnancies; she has a 42 yo and 42 yo. She used to use imitrex, but now uses Advil prn. She has consistently gained weight. 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 I reviewed your office note from 04/24/2017 which you kindly included. Thankfully, she had no significant visual deficits or visual acuity deficit, no change in her glasses Rx for myopia. She does report of recurrent headaches and BMI is in the morbidly obese range of about 40. Her Epworth sleepiness score is 5 out of 24, fatigue score is a female 17. She lives at home with her husband and 2 children. She is a nonsmoker and does not utilize alcohol, drinks caffeine in the form ofsoda, one to 2 small bottles per day on average. She does not have night to night nocturia but has woken up with a headache. She admits that she does not always get 7-8 hours of sleep, goes to bed late, sometimes after midnight and rise time is around 6. She occasionally takes a nap. Snoring is reportedly loud at times. In the past several years she has perpetually  gained weight. She has not worked hard on weight loss she admits.   Observations/Objective:  Generalized: Well developed, in no acute distress  Mentation: Alert oriented to time, place, history taking. Follows all commands speech and language fluent   Assessment and Plan:  43 y.o. year old female  has a past medical history of Allergy, Anemia, Constipation, Dyspnea, Environmental allergies, Hypertension, Pseudotumor cerebri, Sleep apnea, and Vitamin D deficiency. here with    ICD-10-CM   1. OSA on CPAP  G47.33    Z99.89   2. Pseudotumor cerebri  G93.2     Jahleah is doing very well from a headache perspective.  She is tolerating Diamox and reports headaches are much less frequent.  We will continue Diamox 500 mg twice daily.  She has scheduled follow-up with ophthalmology on February 2.  We have discussed need for compliance with CPAP therapy.  She is definitely making progress and  feels that she is becoming more adjusted to therapy pressure adjustment has been beneficial.  We will continue current pressure of 10 cm of water.  She will work towards nightly use and for greater than 4 hours each night.  We have scheduled a follow-up on February 2 to review compliance report as well as follow-up on [place] following ophthalmology visit.  She is aware that she may call sooner for any new or worsening symptoms.  She verbalizes understanding and agreement with this plan.  No orders of the defined types were placed in this encounter.   No orders of the defined types were placed in this encounter.    Follow Up Instructions:  I discussed the assessment and treatment plan with the patient. The patient was provided an opportunity to ask questions and all were answered. The patient agreed with the plan and demonstrated an understanding of the instructions.   The patient was advised to call back or seek an in-person evaluation if the symptoms worsen or if the condition fails to improve as  anticipated.  I provided 15 minutes of non-face-to-face time during this encounter.  Patient is located at her place of residence during my chart visit.  Provider is in the office.   Debbora Presto, NP   I reviewed the above note and documentation by the Nurse Practitioner and agree with the history, exam, assessment and plan as outlined above. I was available for consultation. Star Age, MD, PhD [facility])

## 2019-02-05 ENCOUNTER — Other Ambulatory Visit: Payer: Self-pay | Admitting: Obstetrics and Gynecology

## 2019-02-05 DIAGNOSIS — N6320 Unspecified lump in the left breast, unspecified quadrant: Secondary | ICD-10-CM

## 2019-02-09 ENCOUNTER — Ambulatory Visit
Admission: RE | Admit: 2019-02-09 | Discharge: 2019-02-09 | Disposition: A | Discharge status: Home / Self Care | Payer: BC Managed Care – PPO | Source: Ambulatory Visit | Attending: Obstetrics and Gynecology | Admitting: Obstetrics and Gynecology

## 2019-02-09 ENCOUNTER — Ambulatory Visit: Payer: BC Managed Care – PPO

## 2019-02-09 ENCOUNTER — Other Ambulatory Visit: Payer: Self-pay

## 2019-02-09 DIAGNOSIS — N6320 Unspecified lump in the left breast, unspecified quadrant: Secondary | ICD-10-CM

## 2019-03-31 ENCOUNTER — Ambulatory Visit: Payer: BC Managed Care – PPO | Attending: Internal Medicine

## 2019-03-31 DIAGNOSIS — Z20822 Contact with and (suspected) exposure to covid-19: Secondary | ICD-10-CM

## 2019-04-02 LAB — NOVEL CORONAVIRUS, NAA: SARS-CoV-2, NAA: NOT DETECTED

## 2019-04-08 ENCOUNTER — Other Ambulatory Visit: Payer: BC Managed Care – PPO

## 2019-04-21 ENCOUNTER — Telehealth: Payer: BC Managed Care – PPO | Admitting: Family Medicine

## 2019-05-14 ENCOUNTER — Other Ambulatory Visit: Payer: Self-pay | Admitting: Neurology

## 2019-06-02 ENCOUNTER — Telehealth: Payer: BC Managed Care – PPO | Admitting: Family Medicine

## 2019-06-30 ENCOUNTER — Telehealth (INDEPENDENT_AMBULATORY_CARE_PROVIDER_SITE_OTHER): Payer: BC Managed Care – PPO | Admitting: Family Medicine

## 2019-06-30 ENCOUNTER — Encounter: Payer: Self-pay | Admitting: Family Medicine

## 2019-06-30 DIAGNOSIS — Z9989 Dependence on other enabling machines and devices: Secondary | ICD-10-CM | POA: Diagnosis not present

## 2019-06-30 DIAGNOSIS — G932 Benign intracranial hypertension: Secondary | ICD-10-CM

## 2019-06-30 DIAGNOSIS — G4733 Obstructive sleep apnea (adult) (pediatric): Secondary | ICD-10-CM

## 2019-06-30 NOTE — Progress Notes (Addendum)
PATIENT: Mary Mcdaniel DOB: 1976/03/08  REASON FOR VISIT: follow up HISTORY FROM: patient  Virtual Visit via Telephone Note  I connected with Mary Mcdaniel on 06/30/19 at  8:30 AM EDT by telephone and verified that I am speaking with the correct person using two identifiers.   I discussed the limitations, risks, security and privacy concerns of performing an evaluation and management service by telephone and the availability of in person appointments. I also discussed with the patient that there may be a patient responsible charge related to this service. The patient expressed understanding and agreed to proceed.   History of Present Illness:  06/30/19 Mary Mcdaniel is a 44 y.o. female here today for follow up for OSA on CPAP and IIH. She continues Diamox 539m twice daily and is tolerating well. She had a follow up with ophthalmology in 04/2019 and reports that swelling was completely resolved of one eye and significantly improved in the other. She has requested that [place]. She rarely has headaches. She denies morning headaches. She no longer snores. She had bariatric surgery and has lost 70 pounds. She admits that compliance has not been optimum but she does wish to continue using CPAP.   Compliance report dated 03/31/2019 through 06/28/2019 shows that she used CPAP 30 of the past 90 days for compliance of 33%.  She used CPAP 23 of the last 90 days for more than 4 hours for compliance of 26%.  Average usage on days used was 4 hours and 32 minutes.  Residual AHI was 0.3 on 10 cm of water and an EPR of 3.  There was no significant leak noted.  Current weight is 179lbs.    History (copied from my note on 12/30/2018)  Mary Polackis a 44y.o. female here today for follow up of IIH and OSA on CPAP.  She reports that headaches are well managed.  Last headache was over 2 weeks ago.  She feels that headaches are easily aborted.  She denies any vision changes.   She continues Diamox 500 mg twice daily.  She is scheduled for ophthalmology revisit in February.  She is doing better with CPAP pressure.  Pressure decreased from 14 cm of water to 10 cm of water following bariatric procedure.  She feels that this is much more tolerable.  She has lost approximately 50 pounds.  She feels that she is doing very well.  Compliance report dated 11/29/2018 through 12/28/2018 reveals that she used CPAP 23 of the last 30 days for compliance of 77%.  12 days she used CPAP greater than 4 hours for compliance of 40%.  Average usage was 4 hours and 12 minutes.  AHI was 0.4 on 10 cm of water and an EPR of 3.  There was no significant leak noted.   History (copied from Dr AGuadelupe Sabinnote on 05/12/2018)  Interim history:  Ms. MTierneyis a 44year old right-handed woman with an underlying medical history of hypertension, allergies, anemia, who since for follow-up consultation of her pseudotumor cerebri and sleep apnea. The patient is unaccompanied today. I first met her on 04/30/2017 at the request of her optometrist, at which time she was referred for incidental finding of bilateral papilledema, left more than right. She did report recurrent headaches and sleep disturbance including snoring and morning headaches. She was advised to proceed with a lumbar puncture. She had this on 05/08/2017 with an elevated opening pressure of 37 at the time. She was advised to  start Diamox. She also was advised to have a sleep study. She had a baseline sleep study, followed by a CPAP titration study. Baseline sleep study from 06/02/2017 showed moderate to severe obstructive sleep apnea with a total AHI of 17 per hour, REM AHI of 43.9 per hour and supine AHI of 25.4 per hour, O2 nadir of 68%. She had a CPAP titration study on 07/03/2017 and had good results with a CPAP pressure of 14 cm with an AHI of 2.3 per hour, with supine REM sleep achieved and O2 nadir of 94%. Based on her sleep study results I  prescribed CPAP therapy for home use at a pressure of 14 cm.  She saw Mary Mcdaniel in the interim on 11/07/2017, at which time she was on Diamox. She was using her CPAP nearly nightly but did not stay on it very long each night for her compliance percentage for more than 4 hours of only 33%.   Today, 05/12/2018: I reviewed her CPAP compliance data from 04/08/2018 through 05/07/2018 which is a total of 30 days, during which time she used her machine 27 days with percent used days greater than 4 hours at 57%, indicating suboptimal compliance with an average usage of 4 hours and 19 minutes for days on treatment. Residual AHI at goal at 0.6 per hour, leak low with the 95thpercentile at 4.6 L/Mary on a pressure of 14 cm with EPR of 3. She reports that she had an interim headache recently but it resolved. CPAP is going fairly well. She is taking her Diamox, but has had some tingling in the face and hands and taste changes, all tolerable. Has had some difficulty with swallowing the tablets, as they are chalky. She had a recent headache, which lasted all weekend, ibuprofen helped. She has been going to weight management through [facility] for the past 3 months, has lost about 10 lb thus far. She had an interim on examination with Dr. Scott on 04/29/2018 with resolution of papilledema noted on the right and improved papilledema noted on the left. She is advised to follow-up in one year with her eye doctor. The patient's allergies, current medications, family history, past medical history, past social history, past surgical history and problem list were reviewed and updated as appropriate.  Previously:  04/30/2017: (She) was noted to have bilateral papilledema (per your report L>R) during her routine eye exam on 04/24/2017, which was nearly 2 years after her previous exam. She had no particular Sx at the time. She has a Hx of migraines, but these improved after her pregnancies; she has a ten yo and 43 yo. She used to  use imitrex, but now uses Advil prn. She has consistently gained weight. 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 I reviewed your office note from 04/24/2017 which you kindly included. Thankfully, she had no significant visual deficits or visual acuity deficit, no change in her glasses Rx for myopia. She does report of recurrent headaches and BMI is in the morbidly obese range of about 40. Her Epworth sleepiness score is 5 out of 24, fatigue score is a female 63. She lives at home with her husband and 2 children. She is a nonsmoker and does not utilize alcohol, drinks caffeine in the form ofsoda, one to 2 small bottles per day on average. She does not have night to night nocturia but has woken up with a headache. She admits that she does not always get 7-8 hours of sleep,   goes to bed late, sometimes after midnight and rise time is around 6. She occasionally takes a nap. Snoring is reportedly loud at times. In the past several years she has perpetually gained weight. She has not worked hard on weight loss she admits.   Observations/Objective:  Generalized: Well developed, in no acute distress  Mentation: Alert oriented to time, place, history taking. Follows all commands speech and language fluent   Assessment and Plan:  44 y.o. year old female  has a past medical history of Allergy, Anemia, Constipation, Dyspnea, Environmental allergies, Hypertension, Pseudotumor cerebri, Sleep apnea, and Vitamin D deficiency. here with    ICD-10-CM   1. OSA on CPAP  G47.33    Z99.89   2. Pseudotumor cerebri  G93.2     Kirti is doing very well from a headache perspective.  She continues Diamox 500 mg twice daily without obvious adverse effects.  She rarely has headaches.  We have discussed decreasing Diamox dose to 250 mg twice daily.  She wishes to continue current dosing and will request ophthalmology send [place] the updated eye exam from February before deciding  about medication changes.  We have also discussed the possibility of retesting for sleep apnea as she has lost more than 70 pounds.  She is no longer snoring and no longer wakes with morning headaches.  She wishes to continue weight loss efforts.  She would like to continue CPAP therapy for now.  We will consider retesting in 6 months should she reach her goal weight.  She was encouraged to use CPAP nightly and for greater than 4 hours each night.  She will follow-up with me in 6 months, sooner if needed.  She verbalizes understanding and agreement with this plan.  No orders of the defined types were placed in this encounter.   No orders of the defined types were placed in this encounter.    Follow Up Instructions:  I discussed the assessment and treatment plan with the patient. The patient was provided an opportunity to ask questions and all were answered. The patient agreed with the plan and demonstrated an understanding of the instructions.   The patient was advised to call back or seek an in-person evaluation if the symptoms worsen or if the condition fails to improve as anticipated.  I provided 25 minutes of non-face-to-face time during this encounter.  Patient is located in her car, parked in parking lot during my chart visit.  Provider is located in the office.   Debbora Presto, NP   I reviewed the above note and documentation by the Nurse Practitioner and agree with the history, exam, assessment and plan as outlined above. I was available for consultation. Star Age, MD, PhD [facility])

## 2019-07-29 IMAGING — XA DG FLUORO GUIDE LUMBAR PUNCTURE
1 series · 1 of 1 positions shown · non-contrast
Comparison: none

CLINICAL DATA: Bilateral papilledema.  Occasional headaches.

[Series 1: ortho adipose · 1 of 1 slices shown]
[im 1/1]
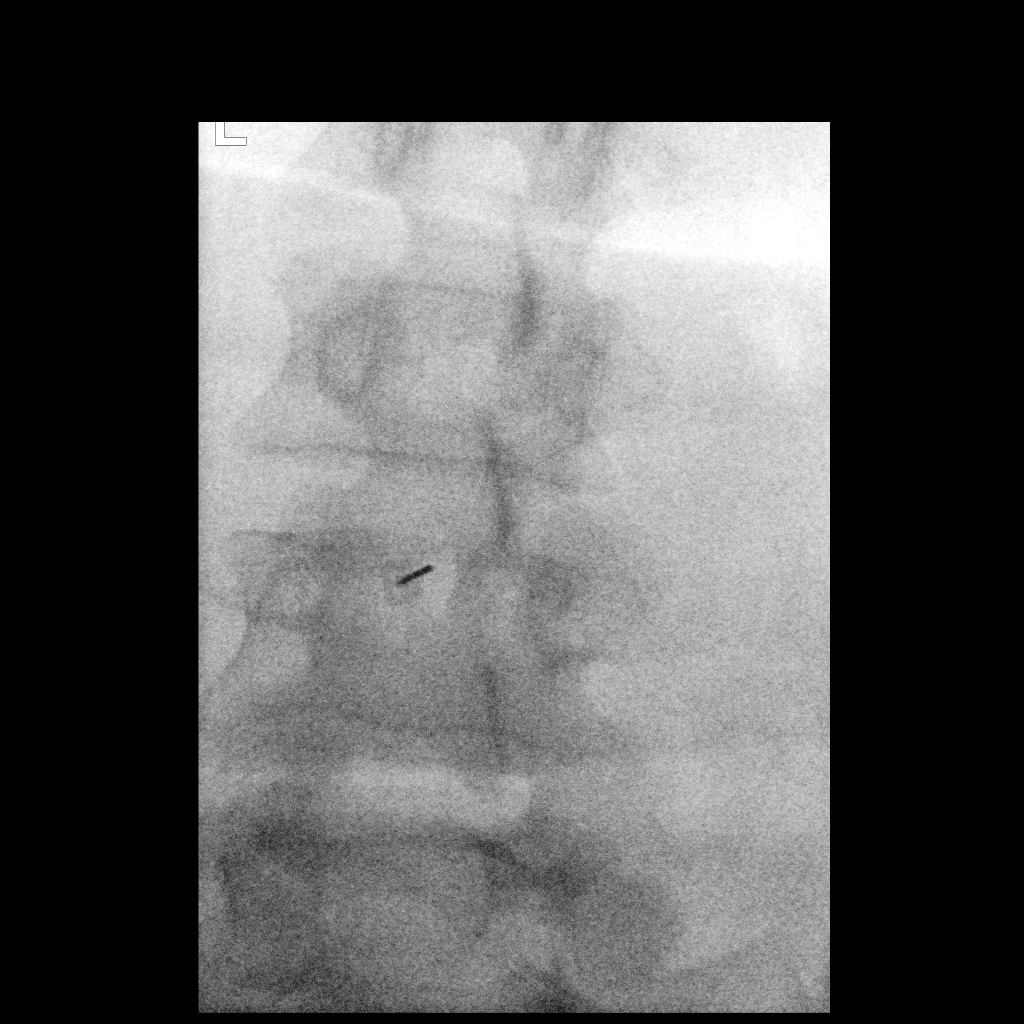

[1 of 1 positions shown; findings below may reference images not displayed]

EXAM:
DIAGNOSTIC LUMBAR PUNCTURE UNDER FLUOROSCOPIC GUIDANCE

FLUOROSCOPY TIME:  Fluoroscopy Time:  3 seconds

Radiation Exposure Index (if provided by the fluoroscopic device):
35.84 microGray*m^2

Number of Acquired Spot Images: 0

PROCEDURE:
Informed consent was obtained from the patient prior to the
procedure, including potential complications of headache, allergy,
and pain. With the patient prone, the lower back was prepped with
Betadine. 1% Lidocaine was used for local anesthesia. Lumbar
puncture was performed at the L3-4 level using a 6 inch 20 gauge via
a left interlaminar approach needle with return of clear CSF with an
opening pressure of 37 cm water (measured in the left lateral
decubitus position). 15 mL of CSF were obtained for laboratory
studies. Closing pressure was 14 cm water. The patient tolerated the
procedure well and there were no apparent complications.
IMPRESSION: Successful fluoroscopic guided lumbar puncture. Elevated opening
pressure of 37 cm water.

## 2020-01-05 ENCOUNTER — Encounter: Payer: Self-pay | Admitting: Family Medicine

## 2020-01-05 ENCOUNTER — Telehealth (INDEPENDENT_AMBULATORY_CARE_PROVIDER_SITE_OTHER): Payer: BC Managed Care – PPO | Admitting: Family Medicine

## 2020-01-05 DIAGNOSIS — G932 Benign intracranial hypertension: Secondary | ICD-10-CM

## 2020-01-05 DIAGNOSIS — Z9989 Dependence on other enabling machines and devices: Secondary | ICD-10-CM | POA: Diagnosis not present

## 2020-01-05 DIAGNOSIS — Z9884 Bariatric surgery status: Secondary | ICD-10-CM

## 2020-01-05 DIAGNOSIS — G4733 Obstructive sleep apnea (adult) (pediatric): Secondary | ICD-10-CM | POA: Diagnosis not present

## 2020-01-05 MED ORDER — ACETAZOLAMIDE ER 500 MG PO CP12: 500.0000 mg | ORAL_CAPSULE | Freq: Two times a day (BID) | ORAL | 1 refills | Status: AC

## 2020-01-05 NOTE — Progress Notes (Addendum)
PATIENT: Mary Mcdaniel DOB: Jun 18, 1975  REASON FOR VISIT: follow up HISTORY FROM: patient  Virtual Visit via Telephone Note  I connected with Mary Mcdaniel on 01/05/20 at  9:30 AM EDT by telephone and verified that I am speaking with the correct person using two identifiers.   I discussed the limitations, risks, security and privacy concerns of performing an evaluation and management service by telephone and the availability of in person appointments. I also discussed with the patient that there may be a patient responsible charge related to this service. The patient expressed understanding and agreed to proceed.   History of Present Illness:  01/05/20 Mary Mcdaniel is a 44 y.o. female here today for follow up for OSA on CPAP and IIH. She continues Diamox 500mg  BID. Headaches are nearly resolved. She may have had one headache in 11/2019. She is tolerating medication well. She has not used CPAP recently. She does not feel that it has been beneficial. She will remove mask during the night. She does not feel the full face mask is comfortable. She has lost about 85 pounds since last sleep study following bariatric surgery. She is requesting to repeat sleep study. No significant family history of sleep apnea.   Compliance report dated 11/05/2019 through 01/03/2020 reveals that she used CPAP therapy 9 of the past 60 days for compliance of 15%.  She used CPAP greater than 4 hours to have the past 60 days for compliance of 3%.  Average usage on days used was 3 hours and 20 minutes.  History (copied from my note on 06/30/2019)  Mary Mcdaniel is a 44 y.o. female here today for follow up for OSA on CPAP and IIH. She continues Diamox 500mg  twice daily and is tolerating well. She had a follow up with ophthalmology in 04/2019 and reports that swelling was completely resolved of one eye and significantly improved in the other. She has requested that notes be sent to for review. She rarely has  headaches. She denies morning headaches. She no longer snores. She had bariatric surgery and has lost 70 pounds. She admits that compliance has not been optimum but she does wish to continue using CPAP.   Compliance report dated 03/31/2019 through 06/28/2019 shows that she used CPAP 30 of the past 90 days for compliance of 33%.  She used CPAP 23 of the last 90 days for more than 4 hours for compliance of 26%.  Average usage on days used was 4 hours and 32 minutes.  Residual AHI was 0.3 on 10 cm of water and an EPR of 3.  There was no significant leak noted.  Current weight is 179lbs.    History (copied from my note on 12/30/2018)  Mary Mcdaniel a 44 y.o.femalehere today for follow up of IIH and OSA on CPAP.She reports that headaches are well managed. Last headache was over 2 weeks ago. She feels that headaches are easily aborted. She denies any vision changes. She continues Diamox 500 mg twice daily. She is scheduled for ophthalmology revisit in February. She is doing better with CPAP pressure. Pressure decreased from 14 cm of water to 10 cm of water following bariatric procedure. She feels that this is much more tolerable. She has lost approximately 50 pounds. She feels that she is doing very well.  Compliance report dated 11/29/2018 through 12/28/2018 reveals that she used CPAP 23 of the last 30 days for compliance of 77%. 12 days she used CPAP greater than 4 hours for compliance of 40%.  Average usage was 4 hours and 12 minutes. AHI was 0.4 on 10 cm of water and an EPR of 3. There was no significant leak noted.   Observations/Objective:  Generalized: Well developed, in no acute distress  Mentation: Alert oriented to time, place, history taking. Follows all commands speech and language fluent   Assessment and Plan:  44 y.o. year old female  has a past medical history of Allergy, Anemia, Constipation, Dyspnea, Environmental allergies, Hypertension, Pseudotumor cerebri,  Sleep apnea, and Vitamin D deficiency. here with    ICD-10-CM   1. OSA on CPAP  G47.33 Home sleep test   Z99.89   2. Pseudotumor cerebri  G93.2   3. History of weight loss surgery  Z98.84      Darionna reports that headaches are well managed on Diamox.  We will continue Diamox 500 mg twice daily.  She continues to struggle with CPAP compliance.  Most recent report reveals 15% compliant with daily use and only 3% compliant with 4-hour use. She does not feel it has helped and is curious if she still needs CPAP following 85 pound weight loss. I will order HST for evaluation. Will continue CPAP pending report. Will consider mask refitting should she continue to need CPAP. She was encouraged to continue healthy lifestyle habits. She will follow up with [place] pending sleep study. She verbalizes understanding and agreement with this plan.    Orders Placed This Encounter  Procedures  . Home sleep test    Standing Status:   Future    Standing Expiration Date:   01/04/2021    Order Specific Question:   Where should this test be performed:    Answer:   [facility]    Meds ordered this encounter  Medications  . acetaZOLAMIDE (DIAMOX) 500 MG capsule    Sig: Take 1 capsule (500 mg total) by mouth 2 (two) times daily.    Dispense:  180 capsule    Refill:  1    Order Specific Question:   Supervising Provider    Answer:   Anson Fret [id number]     Follow Up Instructions:  I discussed the assessment and treatment plan with the patient. The patient was provided an opportunity to ask questions and all were answered. The patient agreed with the plan and demonstrated an understanding of the instructions.   The patient was advised to call back or seek an in-person evaluation if the symptoms worsen or if the condition fails to improve as anticipated.  I provided 15 minutes of non-face-to-face time during this encounter. Patient is located at her place of residence during MyChart  visit. Provider is int he office.    Shawnie Dapper, NP   I reviewed the above note and documentation by the Nurse Practitioner and agree with the history, exam, assessment and plan as outlined above. I was available for consultation. [name], MD, PhD [facility])

## 2020-02-03 ENCOUNTER — Ambulatory Visit (INDEPENDENT_AMBULATORY_CARE_PROVIDER_SITE_OTHER): Payer: BC Managed Care – PPO | Admitting: Neurology

## 2020-02-03 DIAGNOSIS — G4733 Obstructive sleep apnea (adult) (pediatric): Secondary | ICD-10-CM | POA: Diagnosis not present

## 2020-02-03 DIAGNOSIS — R0683 Snoring: Secondary | ICD-10-CM

## 2020-02-03 DIAGNOSIS — Z9989 Dependence on other enabling machines and devices: Secondary | ICD-10-CM

## 2020-02-15 ENCOUNTER — Telehealth: Payer: Self-pay | Admitting: Neurology

## 2020-02-15 ENCOUNTER — Telehealth: Payer: Self-pay | Admitting: *Deleted

## 2020-02-15 DIAGNOSIS — Z9884 Bariatric surgery status: Secondary | ICD-10-CM

## 2020-02-15 NOTE — Progress Notes (Signed)
Will you please let patient know that sleep study looks much better. No significant apnea noted. We will continue to work on healthy lifestyle habits and follow up as directed. TY!

## 2020-02-15 NOTE — Telephone Encounter (Signed)
I called pt. I discuss her sleep study results and recommendations. She will d/c her cpap and we will send the order to [facility]. Pt would like Dr. Frances Furbish to review the records from [facility] and she reports that they have sent this to [place]. I do not see documentation of this. I will ask Stanton Kidney to assist with getting these records. Pt scheduled a follow up with Amy, NP on 06/20/2020 at 9:30am. Pt verbalized understanding of recommendations and results. Pt had no questions at this time but was encouraged to call back if questions arise.

## 2020-02-15 NOTE — Telephone Encounter (Signed)
The patient had a virtual follow-up appointment with Mary Dapper, NP in October 2021.  She has been on Diamox for IIH.  She was reporting difficulty tolerating CPAP.  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  A home sleep test was ordered and she had it on 02/03/2020.  Please call patient and advise her that her home sleep test did not indicate any significant residual sleep apnea.  She had rather mild and intermittent snoring.  We can discontinue CPAP therapy and I can send an order to her DME company or Amy can send a DC order as well.  Please arrange for a follow-up appointment for her to see Amy in routine follow-up for her IIH 6 months after her last appointment.

## 2020-02-15 NOTE — Addendum Note (Signed)
Addended by: Geronimo Running A on: 02/15/2020 01:19 PM   Modules accepted: Orders

## 2020-02-15 NOTE — Telephone Encounter (Signed)
-----   Message from Shawnie Dapper, NP sent at 02/15/2020  2:45 PM EST -----   ----- Message ----- From: [name], MD Sent: 02/15/2020   9:02 AM EST To: Shawnie Dapper, NP

## 2020-02-15 NOTE — Procedures (Signed)
   [facility]  HOME SLEEP TEST (Watch PAT)  STUDY DATE: 02/03/20  DOB: November 19, 1975  MRN: [id number]  ORDERING CLINICIAN: [name], MD, PhD   REFERRING CLINICIAN: Shawnie Dapper, NP  History:  44 y.o. female with OSA and history of IIH. She has struggled with CPAP recently and does not feel that it has been beneficial. She has lost about 85 pounds following bariatric surgery. She presents for re-evaluation of her OSA.    FINDINGS:   Total Record Time (hours, min): 7 H 4 min  Total Sleep Time (hours, min):  6 H 17 min   Percent REM (%):    30.08%   Calculated pAHI (per hour):  3.5      REM pAHI:    8.1     NREM pAHI: 2.2   Oxygen Saturation (%) Mean: 95 Minimum oxygen saturation (%):        91   O2 Saturation Range (%): 99-91 O2Saturation (minutes) <=88%: 0 min   Pulse Mean (bpm):    79 Pulse Range (106-55)   IMPRESSION: Primary snoring   RECOMMENDATION:  This home sleep test does not demonstrate any significant residual obstructive or central sleep disordered breathing. Some snoring was noted and appeared to be mild and intermittent. Other causes of the patient's symptoms, including circadian rhythm disturbances, an underlying mood disorder, medication effect and/or an underlying medical problem cannot be ruled out based on this test. Clinical correlation is recommended. The patient should be cautioned not to drive, work at heights, or operate dangerous or heavy equipment when tired or sleepy. Review and reiteration of good sleep hygiene measures should be pursued with any patient. An appointment in sleep clinic can be made as necessary.   I certify that I have reviewed the raw data recording prior to the issuance of this report in accordance with the standards of the American Academy of Sleep Medicine (AASM).  INTERPRETING PHYSICIAN:    [name], MD, PhD  Board Certified in Neurology and Sleep Medicine [facility] [address]  [address] [place], [place] [address] [phone]

## 2020-02-15 NOTE — Telephone Encounter (Signed)
Order for d/c cpap sent to [facility] via community message. Confirmation received that the order transmitted was successful.

## 2020-02-16 NOTE — Telephone Encounter (Signed)
I called pt. I discussed this with her. She will continue taking diamox and follow up as scheduled in April.

## 2020-02-16 NOTE — Telephone Encounter (Signed)
Received records from [facility]. Given to Dr. Frances Furbish for review.

## 2020-02-16 NOTE — Telephone Encounter (Signed)
I reviewed records from [facility], patient saw Dr. Fredrich Birks, optometrist on 04/20/2018. From the exam findings, she has resolution of her papilledema.  She is routinely scheduled for follow-up with Amy, I think we can slightly consider tapering her Diamox at the time if she continues to do well.

## 2020-03-01 NOTE — Telephone Encounter (Signed)
Mailed mychart message to pt as she had not read.

## 2020-05-12 ENCOUNTER — Telehealth: Payer: Self-pay | Admitting: Neurology

## 2020-05-12 NOTE — Telephone Encounter (Signed)
I received an office note from Dr. Fredrich Birks with [facility].  Patient was seen on 05/10/2020.  Corrected visual acuity was 20/20 bilaterally.  It was felt that her papilledema associated with increased intracranial pressure mostly resolved with weight loss over the last 2 years, he plans to monitor her next year on 05/10/2021.  FYI, nothing further needed.

## 2020-06-16 NOTE — Patient Instructions (Signed)
Below is our plan:  We will discontinue Diamox. Take 500mg  every other day for 2 weeks then stop. Call me with any concerns!  Please make sure you are staying well hydrated. I recommend 50-60 ounces daily. Well balanced diet and regular exercise encouraged. Consistent sleep schedule with 6-8 hours recommended.   Please continue follow up with care team as directed.   Follow up with me in 6 months. If doing well we can follow up as needed.   You may receive a survey regarding today's visit. I encourage you to leave honest feed back as I do use this information to improve patient care. Thank you for seeing me today!

## 2020-06-16 NOTE — Progress Notes (Addendum)
PATIENT: Mary Mcdaniel DOB: [birth year]  REASON FOR VISIT: follow up HISTORY FROM: patient  Virtual Visit via Telephone Note  I connected with Mary Mcdaniel on 06/20/20 at  9:30 AM EDT by telephone and verified that I am speaking with the correct person using two identifiers.   I discussed the limitations, risks, security and privacy concerns of performing an evaluation and management service by telephone and the availability of in person appointments. I also discussed with the patient that there may be a patient responsible charge related to this service. The patient expressed understanding and agreed to proceed.   History of Present Illness:  06/20/20 ALL:  Mary Mcdaniel is a 45 y.o. female here today for follow up for history of IIH and OSA previously on CPAP. Recent HST showed no obstructive or central sleep disordered breathing. She has lost about 80 pounds. She is sleeping well without CPAP. No snoring. No excessive daytime sleepiness. She wakes feeling refreshed.   She has continued acetazolamide 500mg  QD-BID. Eye exam 04/2018 showed resolution of papilledema, received by our office 01/2020. Saw ophthalmology for follow up 04/2020 and reports normal exam. She may have 1 headache per month.    01/05/20 ALL:  Mary Mcdaniel is a 45 y.o. female here today for follow up for OSA on CPAP and IIH. She continues Diamox 500mg  BID. Headaches are nearly resolved. She may have had one headache in 11/2019. She is tolerating medication well. She has not used CPAP recently. She does not feel that it has been beneficial. She will remove mask during the night. She does not feel the full face mask is comfortable. She has lost about 85 pounds since last sleep study following bariatric surgery. She is requesting to repeat sleep study. No significant family history of sleep apnea.   Compliance report dated 11/05/2019 through 01/03/2020 reveals that she used CPAP therapy 9 of the past 60 days for  compliance of 15%.  She used CPAP greater than 4 hours to have the past 60 days for compliance of 3%.  Average usage on days used was 3 hours and 20 minutes.  History (copied from my note on 06/30/2019)  Mary Mcdaniel a 45 y.o.femalehere today for follow up for OSA on CPAP and IIH. She continues Diamox 500mg  twice daily and is tolerating well. She had a follow up with ophthalmology in 04/2019 and reports that swelling was completely resolved of one eye and significantly improved in the other. She has requested that notes be sent to 45 for review. She rarely has headaches. She denies morning headaches. She no longer snores. She had bariatric surgery and has lost 70 pounds. She admits that compliance has not been optimum but she does wish to continue using CPAP.  Compliance report dated 03/31/2019 through 06/28/2019 shows that she used CPAP 30 of the past 90 days for compliance of 33%. She used CPAP 23 of the last 90 days for more than 4 hours for compliance of 26%. Average usage on days used was 4 hours and 32 minutes. Residual AHI was 0.3 on 10 cm of water and an EPR of 3. There was no significant leak noted.  Current weight is 179lbs.    History (copied from my note on 12/30/2018)  Mary Mcdaniel a 45 y.o.femalehere today for follow up of IIH and OSA on CPAP.She reports that headaches are well managed. Last headache was over 2 weeks ago. She feels that headaches are easily aborted. She denies any vision changes. She continues Diamox 500  mg twice daily. She is scheduled for ophthalmology revisit in February. She is doing better with CPAP pressure. Pressure decreased from 14 cm of water to 10 cm of water following bariatric procedure. She feels that this is much more tolerable. She has lost approximately 50 pounds. She feels that she is doing very well.  Compliance report dated 11/29/2018 through 12/28/2018 reveals that she used CPAP 23 of the last 30 days for  compliance of 77%. 12 days she used CPAP greater than 4 hours for compliance of 40%. Average usage was 4 hours and 12 minutes. AHI was 0.4 on 10 cm of water and an EPR of 3. There was no significant leak noted.     Observations/Objective:  Generalized: Well developed, in no acute distress  Mentation: Alert oriented to time, place, history taking. Follows all commands speech and language fluent   Assessment and Plan:  45 y.o. year old female  has a past medical history of Allergy, Anemia, Constipation, Dyspnea, Environmental allergies, Hypertension, Pseudotumor cerebri, Sleep apnea, and Vitamin D deficiency. here with    ICD-10-CM   1. Pseudotumor cerebri  G93.2   2. History of obstructive sleep apnea  Z86.69   3. History of weight loss surgery  Z98.84    Mary Mcdaniel continues to do very well. She has continued healthy lifestyle habits and weight is well managed. We will discontinue Diamox. She will take 500mg  every other day for two weeks then stop medication. She will monitor headaches closely and notify me of any worsening. She will follow up closely with PCP and ophthalmology. She will return to check in with me in 6 months. May follow up as needed if doing well. She verbalizes understanding and agreement with this plan.   No orders of the defined types were placed in this encounter.   No orders of the defined types were placed in this encounter.    Follow Up Instructions:  I discussed the assessment and treatment plan with the patient. The patient was provided an opportunity to ask questions and all were answered. The patient agreed with the plan and demonstrated an understanding of the instructions.   The patient was advised to call back or seek an in-person evaluation if the symptoms worsen or if the condition fails to improve as anticipated.  I provided 15 minutes of non-face-to-face time during this encounter. Patient located at their place of residence during Mychart  visit. Provider is in the office.    , NP   I reviewed the above note and documentation by the Nurse Practitioner and agree with the history, exam, assessment and plan as outlined above. I was available for consultation. Shawnie Dapper, MD, PhD [facility])

## 2020-06-20 ENCOUNTER — Telehealth (INDEPENDENT_AMBULATORY_CARE_PROVIDER_SITE_OTHER): Payer: Federal, State, Local not specified - PPO | Admitting: Family Medicine

## 2020-06-20 ENCOUNTER — Encounter: Payer: Self-pay | Admitting: Family Medicine

## 2020-06-20 DIAGNOSIS — Z9884 Bariatric surgery status: Secondary | ICD-10-CM | POA: Diagnosis not present

## 2020-06-20 DIAGNOSIS — Z8669 Personal history of other diseases of the nervous system and sense organs: Secondary | ICD-10-CM

## 2020-06-20 DIAGNOSIS — G932 Benign intracranial hypertension: Secondary | ICD-10-CM

## 2020-12-19 NOTE — Progress Notes (Deleted)
No chief complaint on file.    HISTORY OF PRESENT ILLNESS:  12/19/20 ALL:  Mary Mcdaniel is a 45 y.o. female here today for follow up for istory of IIH and OSA. She discontinued acetazolamide.   06/20/20 ALL:  Mary Mcdaniel is a 45 y.o. female here today for follow up for history of IIH and OSA previously on CPAP. Recent HST showed no obstructive or central sleep disordered breathing. She has lost about 80 pounds. She is sleeping well without CPAP. No snoring. No excessive daytime sleepiness. She wakes feeling refreshed.   She has continued acetazolamide 500mg  QD-BID. Eye exam 04/2018 showed resolution of papilledema, received by our office 01/2020. Saw ophthalmology for follow up 04/2020 and reports normal exam. She may have 1 headache per month.   01/05/20 ALL:  Mary Mcdaniel is a 45 y.o. female here today for follow up for OSA on CPAP and IIH. She continues Diamox 500mg  BID. Headaches are nearly resolved. She may have had one headache in 11/2019. She is tolerating medication well. She has not used CPAP recently. She does not feel that it has been beneficial. She will remove mask during the night. She does not feel the full face mask is comfortable. She has lost about 85 pounds since last sleep study following bariatric surgery. She is requesting to repeat sleep study. No significant family history of sleep apnea.    Compliance report dated 11/05/2019 through 01/03/2020 reveals that she used CPAP therapy 9 of the past 60 days for compliance of 15%.  She used CPAP greater than 4 hours to have the past 60 days for compliance of 3%.  Average usage on days used was 3 hours and 20 minutes.   REVIEW OF SYSTEMS: Out of a complete 14 system review of symptoms, the patient complains only of the following symptoms, and all other reviewed systems are negative.   ALLERGIES: No Known Allergies   HOME MEDICATIONS: Outpatient Medications Prior to Visit  Medication Sig Dispense Refill    acetaZOLAMIDE (DIAMOX) 500 MG capsule Take 1 capsule (500 mg total) by mouth 2 (two) times daily. 180 capsule 1   fluticasone (FLONASE) 50 MCG/ACT nasal spray Place 2 sprays into both nostrils daily. 48 g 3   levonorgestrel (MIRENA) 20 MCG/24HR IUD 1 each by Intrauterine route once.     loratadine (CLARITIN) 5 MG/5ML syrup Take by mouth daily.     Vitamin D, Ergocalciferol, (DRISDOL) 1.25 MG (50000 UT) CAPS capsule Take 1 capsule (50,000 Units total) by mouth every 7 (seven) days. 4 capsule 0   No facility-administered medications prior to visit.     PAST MEDICAL HISTORY: Past Medical History:  Diagnosis Date   Allergy    Anemia    Constipation    Dyspnea    Environmental allergies    Hypertension    Pseudotumor cerebri    Sleep apnea    Vitamin D deficiency      PAST SURGICAL HISTORY: Past Surgical History:  Procedure Laterality Date   BREAST SURGERY     reduction   REDUCTION MAMMAPLASTY Bilateral 2002     FAMILY HISTORY: Family History  Problem Relation Age of Onset   Cancer Mother        breast   Hypertension Mother    Macular degeneration Mother    Breast cancer Mother    Hypertension Father    Hyperlipidemia Father    Diabetes Father    Cancer Maternal Grandfather        lung  Diabetes Paternal Grandmother    Hypertension Maternal Grandmother    Stroke Maternal Grandmother    Autism Son    Breast cancer Maternal Aunt      SOCIAL HISTORY: Social History   Socioeconomic History   Marital status: Married    Spouse name: Lita Flynn   Number of children: 2   Years of education: Master's   Highest education level: Not on file  Occupational History   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    Comment: Mirena 08/2012  Other Topics Concern   Not on file  Social History Narrative   Married to husband Alycia Rossetti, two biological kids, daughter Kyung Rudd (2008) and son Alycia Rossetti (2014), and an adopted daughter Jon Gills. Alycia Rossetti also has a daughter, Penni Bombard, from a previous relationship.    Social Determinants of Health   Financial Resource Strain: Not on file  Food Insecurity: Not on file  Transportation Needs: Not on file  Physical Activity: Not on file  Stress: Not on file  Social Connections: Not on file  Intimate Partner Violence: Not on file     PHYSICAL EXAM  There were no vitals filed for this visit. There is no height or weight on file to calculate BMI.  Generalized: Well developed, in no acute distress  Cardiology: normal rate and rhythm, no murmur auscultated  Respiratory: clear to auscultation bilaterally    Neurological examination  Mentation: Alert oriented to time, place, history taking. Follows all commands speech and language fluent Cranial nerve II-XII: Pupils were equal round reactive to light. Extraocular movements were full, visual field were full on confrontational test. Facial sensation and strength were normal. Uvula tongue midline. Head turning and shoulder shrug  were normal and symmetric. Motor: The motor testing reveals 5 over 5 strength of all 4 extremities. Good symmetric motor tone is noted throughout.  Sensory: Sensory testing is intact to soft touch on all 4 extremities. No evidence of extinction is noted.  Coordination: Cerebellar testing reveals good finger-nose-finger and heel-to-shin bilaterally.  Gait and station: Gait is normal. Tandem gait is normal. Romberg is negative. No drift is seen.  Reflexes: Deep tendon reflexes are symmetric and normal bilaterally.    DIAGNOSTIC DATA (LABS, IMAGING, TESTING) - I reviewed patient records, labs, notes, testing and imaging myself where  available.  Lab Results  Component Value Date   WBC 6.6 01/08/2018   HGB 11.7 01/08/2018   HCT 37.4 01/08/2018   MCV 72 (L) 01/08/2018   PLT 275 04/16/2017      Component Value Date/Time   NA 137 01/08/2018 1016   K 4.1 01/08/2018 1016   CL 107 (H) 01/08/2018 1016   CO2 17 (L) 01/08/2018 1016   GLUCOSE 90 01/08/2018 1016   GLUCOSE 91 04/26/2015 1048   BUN 9 01/08/2018 1016   CREATININE 0.74 01/08/2018 1016   CREATININE 0.64 04/26/2015 1048   CALCIUM 9.1 01/08/2018 1016   PROT 7.4 01/08/2018 1016   ALBUMIN 4.3 01/08/2018 1016   AST 8 01/08/2018 1016   ALT 5 01/08/2018 1016   ALKPHOS 146 (H) 01/08/2018 1016   BILITOT  0.4 01/08/2018 1016   GFRNONAA 101 01/08/2018 1016   GFRAA 116 01/08/2018 1016   Lab Results  Component Value Date   CHOL 161 04/16/2017   HDL 41 04/16/2017   LDLCALC 101 (H) 04/16/2017   TRIG 97 04/16/2017   CHOLHDL 3.9 04/16/2017   Lab Results  Component Value Date   HGBA1C 5.5 01/08/2018   Lab Results  Component Value Date   VITAMINB12 822 01/08/2018   Lab Results  Component Value Date   TSH 1.710 01/08/2018    No flowsheet data found.   No flowsheet data found.   ASSESSMENT AND PLAN  45 y.o. year old female  has a past medical history of Allergy, Anemia, Constipation, Dyspnea, Environmental allergies, Hypertension, Pseudotumor cerebri, Sleep apnea, and Vitamin D deficiency. here with    No diagnosis found.   No orders of the defined types were placed in this encounter.    No orders of the defined types were placed in this encounter.     Shawnie Dapper, MSN, FNP-C 12/19/2020, 2:57 PM  [facility] [address] [place], [place] [address] [phone]

## 2020-12-21 ENCOUNTER — Ambulatory Visit: Payer: Federal, State, Local not specified - PPO | Admitting: Family Medicine

## 2020-12-21 ENCOUNTER — Encounter: Payer: Self-pay | Admitting: Family Medicine

## 2020-12-21 DIAGNOSIS — Z8669 Personal history of other diseases of the nervous system and sense organs: Secondary | ICD-10-CM

## 2020-12-21 DIAGNOSIS — G932 Benign intracranial hypertension: Secondary | ICD-10-CM

## 2021-05-01 IMAGING — MG DIGITAL DIAGNOSTIC BILAT W/ TOMO W/ CAD
6 of 12 series · 6 of 36 positions shown · non-contrast
Comparison: Previous exam(s).

CLINICAL DATA: Two palpable areas in the right breast felt by the
patient a few weeks ago. Patient has a history of bilateral breast
reduction in 3113.

EXAM:
DIGITAL DIAGNOSTIC BILATERAL MAMMOGRAM WITH CAD AND TOMO

[R TAN synth-2D (1 of 2)]
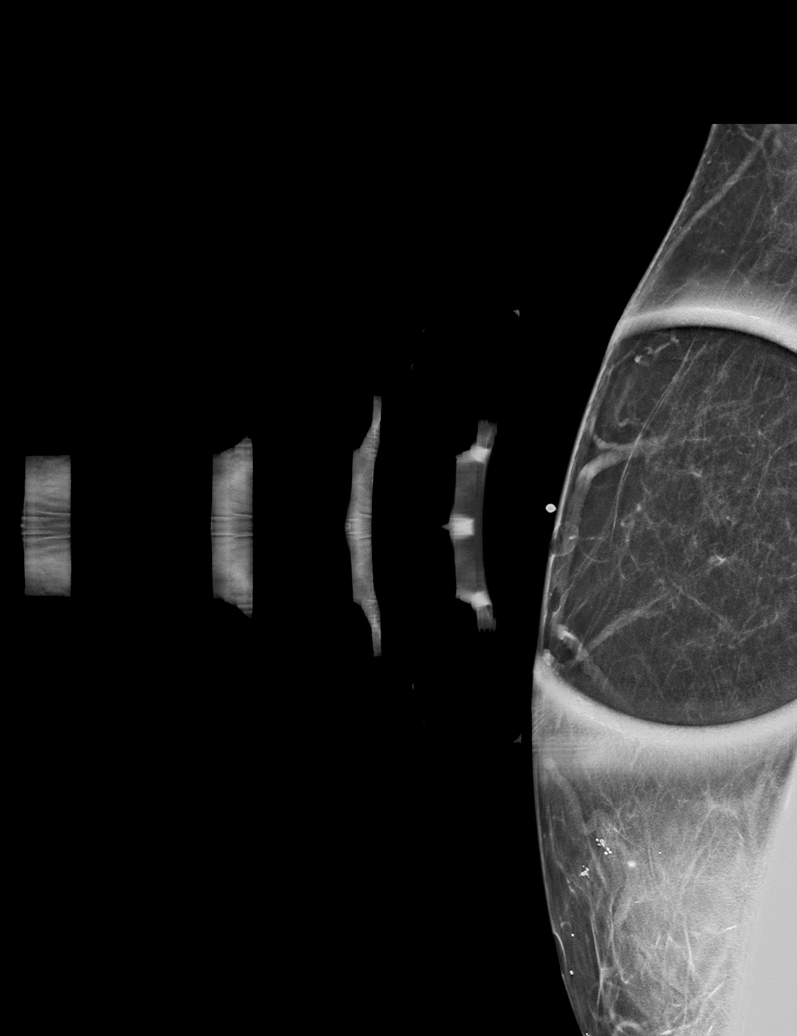

[L CC synth-2D]
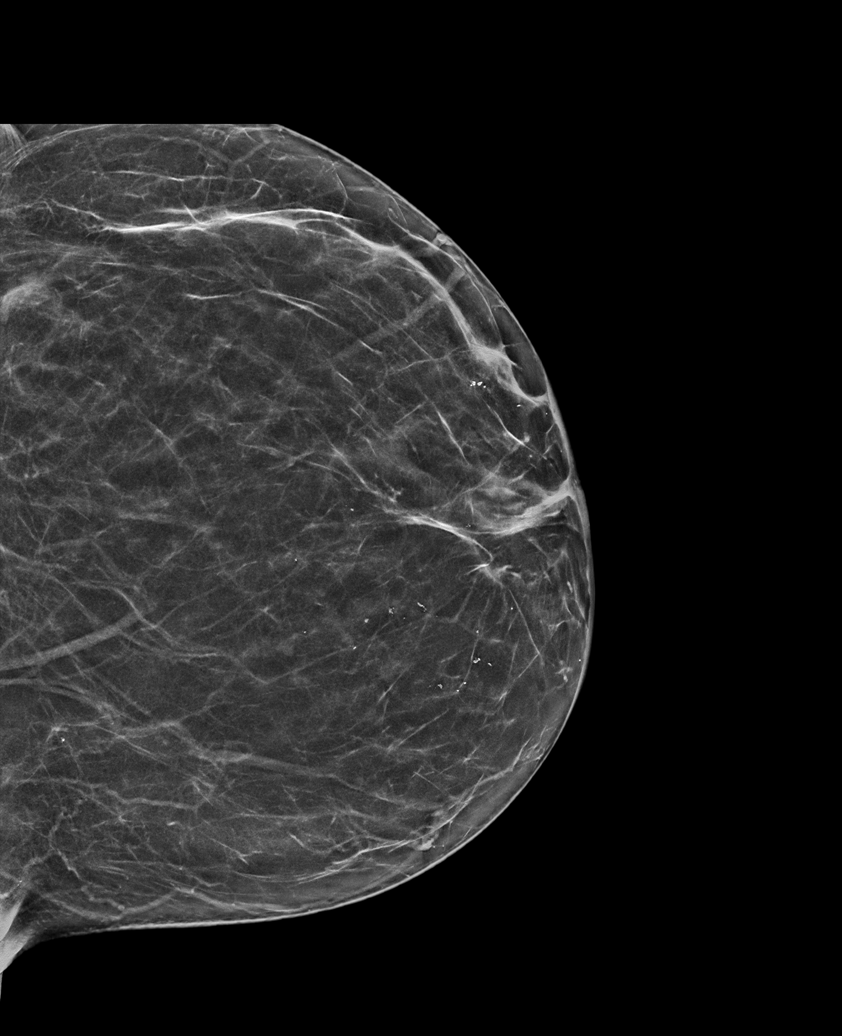

[R CC synth-2D]
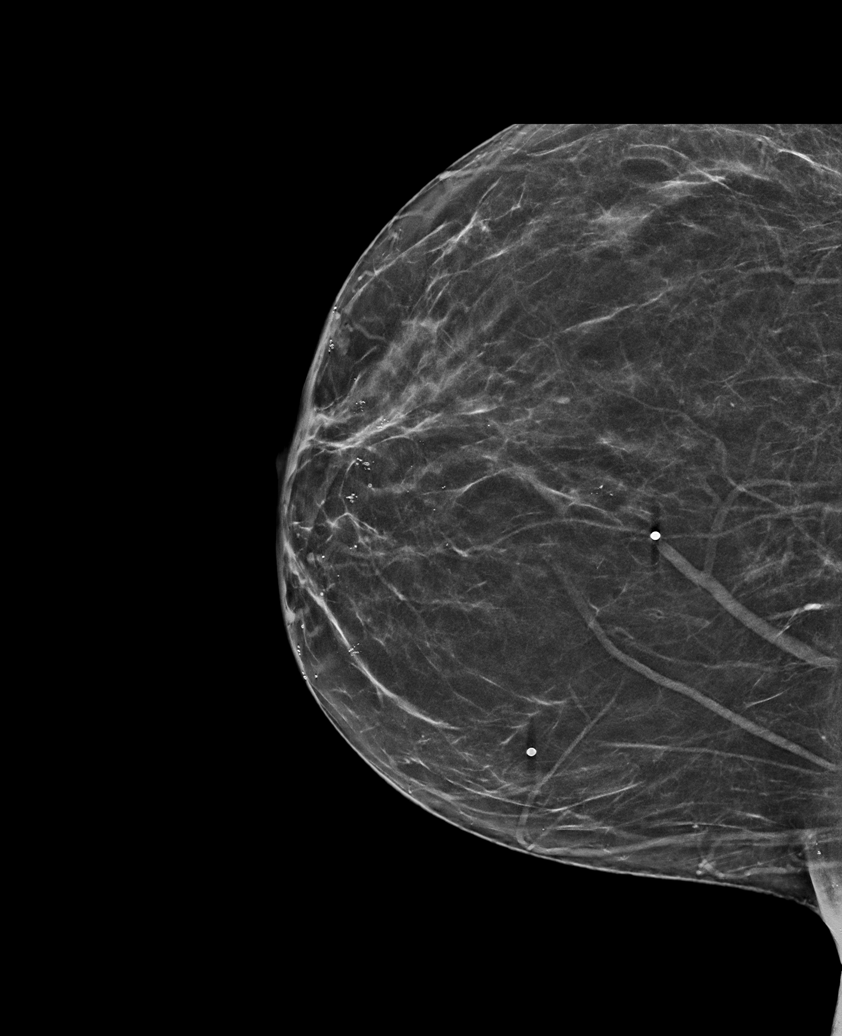

[R TAN synth-2D (2 of 2)]
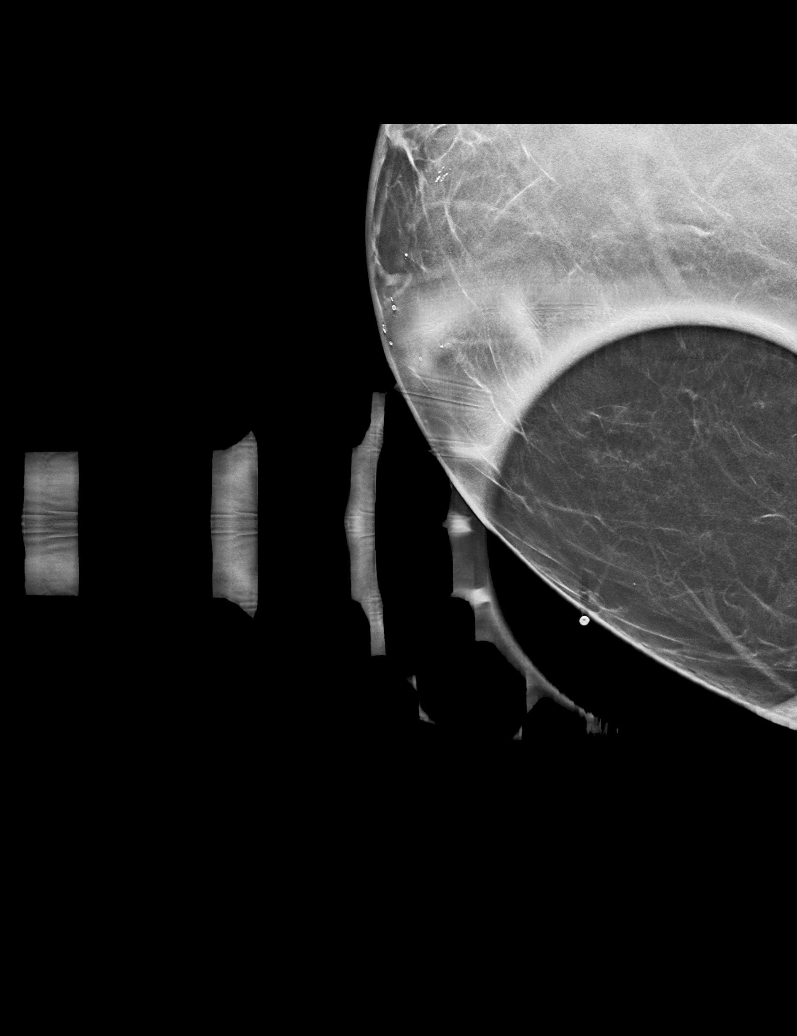

[R MLO synth-2D]
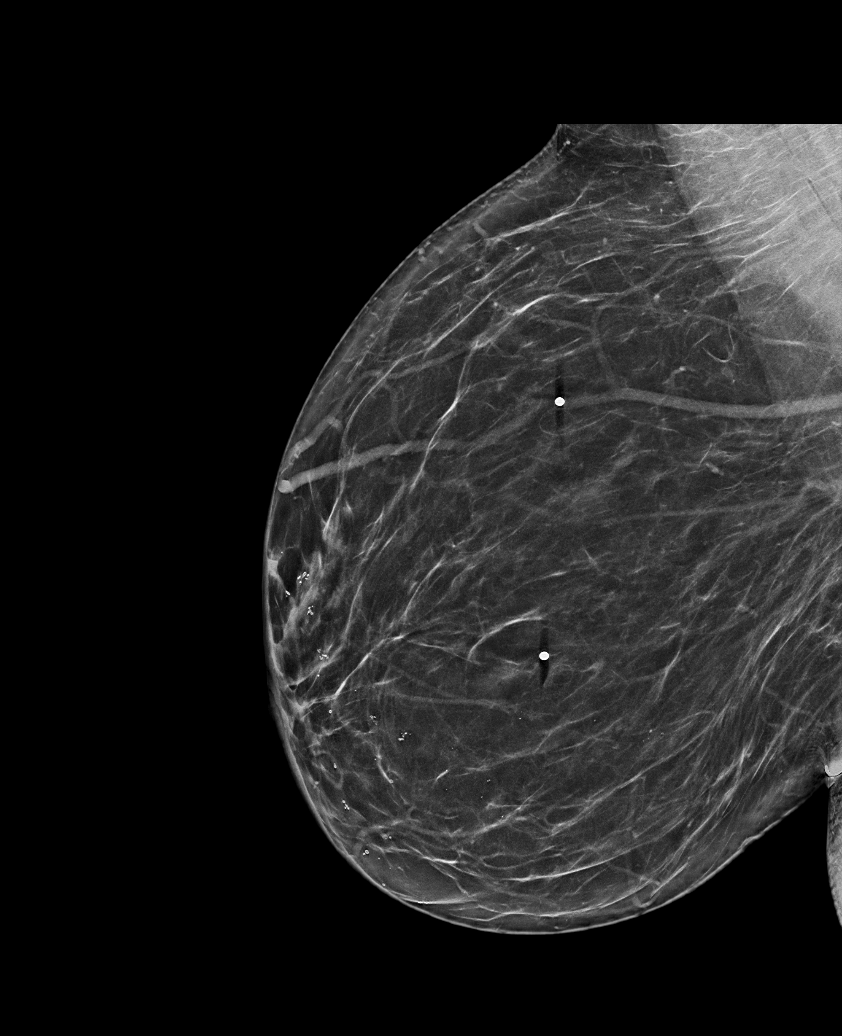

[L MLO synth-2D]
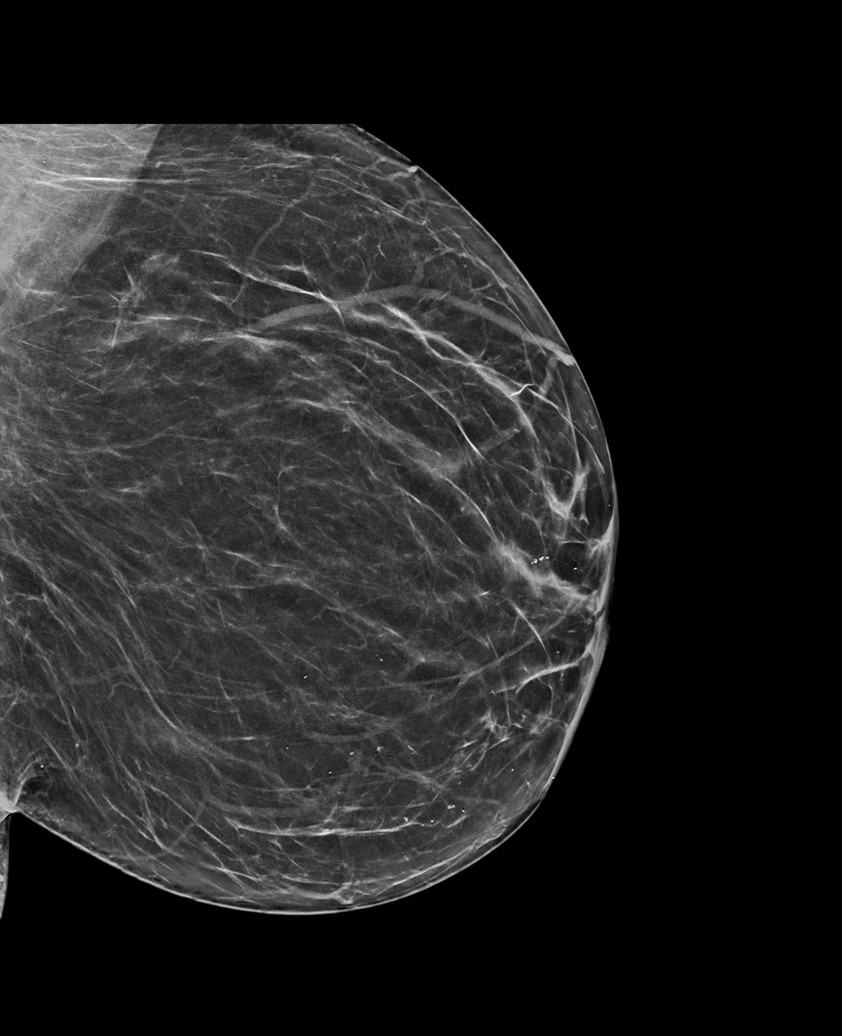

[6 of 36 positions shown; findings below may reference images not displayed]

ACR Breast Density Category b: There are scattered areas of
fibroglandular density.
FINDINGS: Multiple benign oil cysts/fat necrosis are seen in the right breast,
to which underlie palpable skin markers. Reduction mammoplasty
changes are seen in both breasts. No suspicious mass,
microcalcification, or other finding is identified in either breast.

Mammographic images were processed with CAD.

Targeted physical exam demonstrates hard mobile masses directly
beneath the skin in the palpable areas of concern, consistent with
the benign oil cyst/fat necrosis seen on the mammogram.
IMPRESSION: Benign oil cysts/fat necrosis in the right breast corresponding to
the palpable areas of concern. No evidence of malignancy in either
breast.

RECOMMENDATION:
Recommend routine annual screening mammogram in 1 year.

I have discussed the findings and recommendations with the patient.
If applicable, a reminder letter will be sent to the patient
regarding the next appointment.

BI-RADS CATEGORY  2: Benign.

## 2022-04-25 ENCOUNTER — Other Ambulatory Visit: Payer: Self-pay | Admitting: Obstetrics and Gynecology

## 2022-04-25 DIAGNOSIS — R928 Other abnormal and inconclusive findings on diagnostic imaging of breast: Secondary | ICD-10-CM

## 2022-05-07 ENCOUNTER — Ambulatory Visit
Admission: RE | Admit: 2022-05-07 | Discharge: 2022-05-07 | Disposition: A | Discharge status: Home / Self Care | Payer: BC Managed Care – PPO | Source: Ambulatory Visit | Attending: Obstetrics and Gynecology | Admitting: Obstetrics and Gynecology

## 2022-05-07 DIAGNOSIS — R928 Other abnormal and inconclusive findings on diagnostic imaging of breast: Secondary | ICD-10-CM

## 2022-06-13 ENCOUNTER — Encounter: Payer: Self-pay | Admitting: Physician Assistant

## 2023-11-12 ENCOUNTER — Encounter: Payer: Self-pay | Admitting: Physician Assistant
